# Patient Record
Sex: Female | Born: 1970
Health system: Southern US, Community
[De-identification: ages and names within clinical notes are randomized; demographics above are authoritative.]

## PROBLEM LIST (undated history)

## (undated) DIAGNOSIS — M1990 Unspecified osteoarthritis, unspecified site: Secondary | ICD-10-CM

## (undated) DIAGNOSIS — I10 Essential (primary) hypertension: Secondary | ICD-10-CM

## (undated) DIAGNOSIS — F32A Depression, unspecified: Secondary | ICD-10-CM

## (undated) DIAGNOSIS — F419 Anxiety disorder, unspecified: Secondary | ICD-10-CM

## (undated) DIAGNOSIS — N840 Polyp of corpus uteri: Secondary | ICD-10-CM

## (undated) HISTORY — DX: Unspecified osteoarthritis, unspecified site: M19.90

## (undated) HISTORY — DX: Polyp of corpus uteri: N84.0

## (undated) HISTORY — PX: NO PAST SURGERIES: SHX2092

---

## 1998-07-12 ENCOUNTER — Other Ambulatory Visit: Admission: RE | Admit: 1998-07-12 | Discharge: 1998-07-12 | Payer: Self-pay | Admitting: Obstetrics and Gynecology

## 1999-08-22 ENCOUNTER — Other Ambulatory Visit: Admission: RE | Admit: 1999-08-22 | Discharge: 1999-08-22 | Payer: Self-pay | Admitting: *Deleted

## 2000-09-18 ENCOUNTER — Other Ambulatory Visit: Admission: RE | Admit: 2000-09-18 | Discharge: 2000-09-18 | Payer: Self-pay | Admitting: *Deleted

## 2001-09-30 ENCOUNTER — Other Ambulatory Visit: Admission: RE | Admit: 2001-09-30 | Discharge: 2001-09-30 | Payer: Self-pay | Admitting: Obstetrics and Gynecology

## 2002-10-12 ENCOUNTER — Other Ambulatory Visit: Admission: RE | Admit: 2002-10-12 | Discharge: 2002-10-12 | Payer: Self-pay | Admitting: Obstetrics and Gynecology

## 2003-06-13 ENCOUNTER — Other Ambulatory Visit: Admission: RE | Admit: 2003-06-13 | Discharge: 2003-06-13 | Payer: Self-pay | Admitting: Obstetrics and Gynecology

## 2004-01-06 ENCOUNTER — Inpatient Hospital Stay (HOSPITAL_COMMUNITY): Admission: AD | Admit: 2004-01-06 | Discharge: 2004-01-06 | Payer: Self-pay | Admitting: Obstetrics and Gynecology

## 2004-01-07 ENCOUNTER — Inpatient Hospital Stay (HOSPITAL_COMMUNITY): Admission: AD | Admit: 2004-01-07 | Discharge: 2004-01-09 | Payer: Self-pay | Admitting: *Deleted

## 2004-01-10 ENCOUNTER — Encounter: Admission: RE | Admit: 2004-01-10 | Discharge: 2004-02-09 | Payer: Self-pay | Admitting: Obstetrics and Gynecology

## 2004-02-10 ENCOUNTER — Encounter: Admission: RE | Admit: 2004-02-10 | Discharge: 2004-03-11 | Payer: Self-pay | Admitting: Obstetrics and Gynecology

## 2004-04-11 ENCOUNTER — Encounter: Admission: RE | Admit: 2004-04-11 | Discharge: 2004-05-11 | Payer: Self-pay | Admitting: Obstetrics and Gynecology

## 2004-05-12 ENCOUNTER — Encounter: Admission: RE | Admit: 2004-05-12 | Discharge: 2004-06-11 | Payer: Self-pay | Admitting: Obstetrics and Gynecology

## 2005-09-11 ENCOUNTER — Ambulatory Visit: Payer: Self-pay | Admitting: Family Medicine

## 2006-11-27 ENCOUNTER — Encounter: Admission: RE | Admit: 2006-11-27 | Discharge: 2006-11-27 | Payer: Self-pay | Admitting: Obstetrics and Gynecology

## 2007-01-26 ENCOUNTER — Inpatient Hospital Stay (HOSPITAL_COMMUNITY): Admission: AD | Admit: 2007-01-26 | Discharge: 2007-01-29 | Payer: Self-pay | Admitting: *Deleted

## 2007-01-30 ENCOUNTER — Encounter: Admission: RE | Admit: 2007-01-30 | Discharge: 2007-03-01 | Payer: Self-pay | Admitting: *Deleted

## 2007-03-02 ENCOUNTER — Encounter: Admission: RE | Admit: 2007-03-02 | Discharge: 2007-03-31 | Payer: Self-pay | Admitting: *Deleted

## 2007-04-01 ENCOUNTER — Encounter: Admission: RE | Admit: 2007-04-01 | Discharge: 2007-05-01 | Payer: Self-pay | Admitting: *Deleted

## 2007-04-23 ENCOUNTER — Ambulatory Visit: Payer: Self-pay | Admitting: Family Medicine

## 2007-04-23 DIAGNOSIS — J029 Acute pharyngitis, unspecified: Secondary | ICD-10-CM

## 2007-05-02 ENCOUNTER — Encounter: Admission: RE | Admit: 2007-05-02 | Discharge: 2007-06-01 | Payer: Self-pay | Admitting: *Deleted

## 2007-05-22 ENCOUNTER — Telehealth (INDEPENDENT_AMBULATORY_CARE_PROVIDER_SITE_OTHER): Payer: Self-pay | Admitting: *Deleted

## 2007-06-02 ENCOUNTER — Encounter: Admission: RE | Admit: 2007-06-02 | Discharge: 2007-07-01 | Payer: Self-pay | Admitting: *Deleted

## 2007-07-02 ENCOUNTER — Encounter: Admission: RE | Admit: 2007-07-02 | Discharge: 2007-08-01 | Payer: Self-pay | Admitting: *Deleted

## 2008-11-02 ENCOUNTER — Inpatient Hospital Stay (HOSPITAL_COMMUNITY): Admission: RE | Admit: 2008-11-02 | Discharge: 2008-11-04 | Payer: Self-pay | Admitting: Obstetrics

## 2008-11-05 ENCOUNTER — Encounter: Admission: RE | Admit: 2008-11-05 | Discharge: 2008-12-02 | Payer: Self-pay | Admitting: Obstetrics

## 2008-12-03 ENCOUNTER — Encounter: Admission: RE | Admit: 2008-12-03 | Discharge: 2008-12-30 | Payer: Self-pay | Admitting: Obstetrics

## 2010-08-17 ENCOUNTER — Ambulatory Visit (HOSPITAL_COMMUNITY)
Admission: RE | Admit: 2010-08-17 | Discharge: 2010-08-17 | Payer: Self-pay | Source: Home / Self Care | Attending: Obstetrics | Admitting: Obstetrics

## 2010-11-21 ENCOUNTER — Other Ambulatory Visit (HOSPITAL_COMMUNITY): Payer: Self-pay | Admitting: Obstetrics

## 2010-11-21 DIAGNOSIS — Z302 Encounter for sterilization: Secondary | ICD-10-CM

## 2010-11-27 ENCOUNTER — Ambulatory Visit (HOSPITAL_COMMUNITY)
Admission: RE | Admit: 2010-11-27 | Discharge: 2010-11-27 | Disposition: A | Discharge status: Home / Self Care | Payer: BC Managed Care – PPO | Source: Ambulatory Visit | Attending: Obstetrics | Admitting: Obstetrics

## 2010-11-27 DIAGNOSIS — Z302 Encounter for sterilization: Secondary | ICD-10-CM

## 2010-11-27 DIAGNOSIS — Z3049 Encounter for surveillance of other contraceptives: Secondary | ICD-10-CM | POA: Insufficient documentation

## 2010-12-25 LAB — CBC
HCT: 29.7 % — ABNORMAL LOW (ref 36.0–46.0)
HCT: 35.3 % — ABNORMAL LOW (ref 36.0–46.0)
Hemoglobin: 10.3 g/dL — ABNORMAL LOW (ref 12.0–15.0)
MCHC: 34 g/dL (ref 30.0–36.0)
MCV: 90.9 fL (ref 78.0–100.0)
Platelets: 174 10*3/uL (ref 150–400)
RBC: 3.27 MIL/uL — ABNORMAL LOW (ref 3.87–5.11)
RDW: 13.8 % (ref 11.5–15.5)
WBC: 8.6 10*3/uL (ref 4.0–10.5)

## 2010-12-25 LAB — RPR: RPR Ser Ql: NONREACTIVE

## 2012-06-23 ENCOUNTER — Other Ambulatory Visit: Payer: Self-pay | Admitting: Obstetrics

## 2012-06-23 DIAGNOSIS — Z1231 Encounter for screening mammogram for malignant neoplasm of breast: Secondary | ICD-10-CM

## 2012-07-23 ENCOUNTER — Ambulatory Visit
Admission: RE | Admit: 2012-07-23 | Discharge: 2012-07-23 | Disposition: A | Discharge status: Home / Self Care | Payer: BC Managed Care – PPO | Source: Ambulatory Visit | Attending: Obstetrics | Admitting: Obstetrics

## 2012-07-23 DIAGNOSIS — Z1231 Encounter for screening mammogram for malignant neoplasm of breast: Secondary | ICD-10-CM

## 2012-07-28 ENCOUNTER — Other Ambulatory Visit: Payer: Self-pay | Admitting: Obstetrics

## 2012-07-28 DIAGNOSIS — R928 Other abnormal and inconclusive findings on diagnostic imaging of breast: Secondary | ICD-10-CM

## 2012-08-03 ENCOUNTER — Ambulatory Visit
Admission: RE | Admit: 2012-08-03 | Discharge: 2012-08-03 | Disposition: A | Discharge status: Home / Self Care | Payer: BC Managed Care – PPO | Source: Ambulatory Visit | Attending: Obstetrics | Admitting: Obstetrics

## 2012-08-03 DIAGNOSIS — R928 Other abnormal and inconclusive findings on diagnostic imaging of breast: Secondary | ICD-10-CM

## 2012-12-11 ENCOUNTER — Encounter (HOSPITAL_COMMUNITY): Payer: Self-pay | Admitting: *Deleted

## 2012-12-11 ENCOUNTER — Emergency Department (HOSPITAL_COMMUNITY)
Admission: EM | Admit: 2012-12-11 | Discharge: 2012-12-11 | Disposition: A | Discharge status: Home / Self Care | Payer: BC Managed Care – PPO | Attending: Emergency Medicine | Admitting: Emergency Medicine

## 2012-12-11 DIAGNOSIS — S0990XA Unspecified injury of head, initial encounter: Secondary | ICD-10-CM | POA: Insufficient documentation
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
## 2012-12-11 NOTE — ED Notes (Signed)
[employer] at bedside 

## 2012-12-12 NOTE — ED Provider Notes (Signed)
  Medical screening examination/treatment/procedure(s) were performed by non-physician practitioner and as supervising physician I was immediately available for consultation/collaboration.    Dimitrious Micciche, MD 12/12/12 0029 

## 2013-05-03 ENCOUNTER — Ambulatory Visit (INDEPENDENT_AMBULATORY_CARE_PROVIDER_SITE_OTHER): Payer: BC Managed Care – PPO | Admitting: Family Medicine

## 2013-05-03 ENCOUNTER — Encounter: Payer: Self-pay | Admitting: Family Medicine

## 2013-05-03 VITALS — BP 110/74 | HR 76 | Temp 98.3°F | Wt 203.0 lb

## 2013-05-03 DIAGNOSIS — J029 Acute pharyngitis, unspecified: Secondary | ICD-10-CM

## 2013-05-03 DIAGNOSIS — Z639 Problem related to primary support group, unspecified: Secondary | ICD-10-CM | POA: Insufficient documentation

## 2013-05-03 DIAGNOSIS — J020 Streptococcal pharyngitis: Secondary | ICD-10-CM

## 2013-05-03 NOTE — Progress Notes (Signed)
Subjective:    Patient ID: Sheri Cole, female    DOB: 09/14/1970, 42 y.o.   MRN: [id number]  HPI CC: ST  [name] presents today after not being seen here since 2008 for acute visit of sore throat. Sore throat present for last 6 days.  Affecting sleep at night.  Hurts to swallow.  Also noticed tender glands in neck.  Mild PNDrainage.  Mild earache. Some menstrual headaches (common for her).  No fevers/chills, rhinorrhea, congestion, sneezing, cough, tooth pain.  No abd pain, nausea/vomiting.  So far has tried tylenol, throat sprays OTC and gargling. 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 No h/o allergic rhinitis.  Stressful year- husband committed suicide 12/2012.  They were in process of divorcing Children (4yo, 6yo, 9yo) involved in [employer]. Pt feels doing well with grieving process.  Lives with children (4yo, 6yo, 31yo) Occupation: sells food  Preventative:  Well woman with OBGYN Dr. Algie Coffer (06/2012) - normal. Tetanus - unsure.  Thinks less than 10 yrs.  Medications and allergies reviewed and updated in chart.  Past histories reviewed and updated if relevant as below. Patient Active Problem List   Diagnosis Date Noted  . PHARYNGITIS, STREPTOCOCCAL 04/23/2007   History reviewed. No pertinent past medical history. Past Surgical History  Procedure Laterality Date  . Tubal ligation  2011   History  Substance Use Topics  . Smoking status: Never Smoker   . Smokeless tobacco: Never Used  . Alcohol Use: Yes     Comment: occasionally   Family History  Problem Relation Age of Onset  . Cancer Father     metastatic lung (smoker)  . Cancer Maternal Grandmother     breast, leukemia  . Cancer Paternal Grandmother     ovarian or cervical  . Diabetes Father   . Hypertension Neg Hx   . CAD Neg Hx   . Stroke Neg Hx    Allergies  Allergen Reactions  . Sulfonamide Derivatives     REACTION: hives   No current outpatient prescriptions on file prior to  visit.   No current facility-administered medications on file prior to visit.    Review of Systems Per HPI    Objective:   Physical Exam  Nursing note and vitals reviewed. Constitutional: She appears well-developed and well-nourished. No distress.  HENT:  Head: Normocephalic and atraumatic.  Right Ear: Hearing, tympanic membrane, external ear and ear canal normal.  Left Ear: Hearing, tympanic membrane, external ear and ear canal normal.  Nose: Nose normal. No mucosal edema or rhinorrhea. Right sinus exhibits no maxillary sinus tenderness and no frontal sinus tenderness. Left sinus exhibits no maxillary sinus tenderness and no frontal sinus tenderness.  Mouth/Throat: Uvula is midline and mucous membranes are normal. Posterior oropharyngeal edema and posterior oropharyngeal erythema present. No oropharyngeal exudate or tonsillar abscesses.  No exudates  Eyes: Conjunctivae and EOM are normal. Pupils are equal, round, and reactive to light. No scleral icterus.  Neck: Normal range of motion. Neck supple.  Cardiovascular: Normal rate, regular rhythm, normal heart sounds and intact distal pulses.   No murmur heard. Pulmonary/Chest: Effort normal and breath sounds normal. No respiratory distress. She has no wheezes. She has no rales.  Musculoskeletal: She exhibits no edema.  Lymphadenopathy:    She has no cervical adenopathy.  Skin: Skin is warm and dry. No rash noted.  Psychiatric: She has a normal mood and affect. Her behavior is normal. Judgment and thought content normal.       Assessment &  Plan:

## 2013-05-03 NOTE — Patient Instructions (Addendum)
You have viral pharyngitis. Push fluids and plenty of rest. May use ibuprofen 400-600mg  with food for throat inflammation. Salt water gargles. Suck on cold things like popsicles or warm things like herbal teas (whichever soothes the throat better). Return if fever >101.5, worsening pain, or trouble opening/closing mouth, or hoarse voice. Cancel upcoming appointment, but bring me a copy of recent blood work to update your chart. Good to see you today, call clinic with questions.

## 2013-05-03 NOTE — Assessment & Plan Note (Signed)
Anticipate viral given lack of cervical adenopathy and fever. Supportive care as per instructions.

## 2013-05-03 NOTE — Assessment & Plan Note (Signed)
Discussed husband's recent suicide. Pt has good support network at home. Considering counseling mainly for benefit of children and help in answering their questions. Knows where to seek help if becoming overwhelming.

## 2013-05-13 ENCOUNTER — Ambulatory Visit: Payer: BC Managed Care – PPO | Admitting: Family Medicine

## 2013-07-14 ENCOUNTER — Other Ambulatory Visit: Payer: Self-pay

## 2013-07-14 DIAGNOSIS — Z1231 Encounter for screening mammogram for malignant neoplasm of breast: Secondary | ICD-10-CM

## 2013-07-14 DIAGNOSIS — Z803 Family history of malignant neoplasm of breast: Secondary | ICD-10-CM

## 2013-08-17 ENCOUNTER — Ambulatory Visit
Admission: RE | Admit: 2013-08-17 | Discharge: 2013-08-17 | Disposition: A | Discharge status: Home / Self Care | Payer: BC Managed Care – PPO | Source: Ambulatory Visit

## 2013-08-17 DIAGNOSIS — Z1231 Encounter for screening mammogram for malignant neoplasm of breast: Secondary | ICD-10-CM

## 2013-08-17 DIAGNOSIS — Z803 Family history of malignant neoplasm of breast: Secondary | ICD-10-CM

## 2013-08-19 ENCOUNTER — Other Ambulatory Visit: Payer: Self-pay | Admitting: Obstetrics

## 2013-08-19 DIAGNOSIS — R928 Other abnormal and inconclusive findings on diagnostic imaging of breast: Secondary | ICD-10-CM

## 2013-09-07 ENCOUNTER — Ambulatory Visit
Admission: RE | Admit: 2013-09-07 | Discharge: 2013-09-07 | Disposition: A | Discharge status: Home / Self Care | Payer: BC Managed Care – PPO | Source: Ambulatory Visit | Attending: Obstetrics | Admitting: Obstetrics

## 2013-09-07 DIAGNOSIS — R928 Other abnormal and inconclusive findings on diagnostic imaging of breast: Secondary | ICD-10-CM

## 2013-09-21 ENCOUNTER — Ambulatory Visit (INDEPENDENT_AMBULATORY_CARE_PROVIDER_SITE_OTHER): Payer: BC Managed Care – PPO | Admitting: Internal Medicine

## 2013-09-21 ENCOUNTER — Encounter: Payer: Self-pay | Admitting: Internal Medicine

## 2013-09-21 VITALS — BP 124/86 | HR 73 | Temp 98.0°F | Wt 207.8 lb

## 2013-09-21 DIAGNOSIS — J020 Streptococcal pharyngitis: Secondary | ICD-10-CM

## 2013-09-21 DIAGNOSIS — J029 Acute pharyngitis, unspecified: Secondary | ICD-10-CM

## 2013-09-21 LAB — POCT RAPID STREP A (OFFICE): RAPID STREP A SCREEN: NEGATIVE

## 2013-09-21 NOTE — Patient Instructions (Signed)
Viral Pharyngitis Viral pharyngitis is a viral infection that produces redness, pain, and swelling (inflammation) of the throat. It can spread from person to person (contagious). CAUSES Viral pharyngitis is caused by inhaling a large amount of certain germs called viruses. Many different viruses cause viral pharyngitis. SYMPTOMS Symptoms of viral pharyngitis include:  Sore throat.  Tiredness.  Stuffy nose.  Low-grade fever.  Congestion.  Cough. TREATMENT Treatment includes rest, drinking plenty of fluids, and the use of over-the-counter medication (approved by your caregiver). HOME CARE INSTRUCTIONS   Drink enough fluids to keep your urine clear or pale yellow.  Eat soft, cold foods such as ice cream, frozen ice pops, or gelatin dessert.  Gargle with warm salt water (1 tsp salt per 1 qt of water).  If over age 7, throat lozenges may be used safely.  Only take over-the-counter or prescription medicines for pain, discomfort, or fever as directed by your caregiver. Do not take aspirin. To help prevent spreading viral pharyngitis to others, avoid:  Mouth-to-mouth contact with others.  Sharing utensils for eating and drinking.  Coughing around others. SEEK MEDICAL CARE IF:   You are better in a few days, then become worse.  You have a fever or pain not helped by pain medicines.  There are any other changes that concern you. Document Released: 06/05/2005 Document Revised: 11/18/2011 Document Reviewed: 11/01/2010 [employer] Patient Information 2014 [employer].  

## 2013-09-21 NOTE — Progress Notes (Signed)
HPI  Pt presents to the clinic today with c/o sore throat. This started about 3 days ago. She does report mild pain with swallowing and white spots on her tonsils. She denies fevers, chills or body aches. She has tried gargling with salt water. She has had sick contacts.  Review of Systems     History reviewed. No pertinent past medical history.  Family History  Problem Relation Age of Onset  . Cancer Father     metastatic lung (smoker)  . Cancer Maternal Grandmother     breast, leukemia  . Cancer Paternal Grandmother     ovarian or cervical  . Diabetes Father   . Hypertension Neg Hx   . CAD Neg Hx   . Stroke Neg Hx     History   Social History  . Marital Status: Married    Spouse Name: N/A    Number of Children: N/A  . Years of Education: N/A   Occupational History  . Not on file.   Social History Main Topics  . Smoking status: Never Smoker   . Smokeless tobacco: Never Used  . Alcohol Use: Yes     Comment: occasionally  . Drug Use: No  . Sexual Activity: Not on file   Other Topics Concern  . Not on file   Social History Narrative   Husband committed suicide 12/2012   Lives with children (2yo, 6yo, 35yo)   Occupation: sells food    Allergies  Allergen Reactions  . Sulfonamide Derivatives     REACTION: hives     Constitutional:  Denies headache, fatigue, fever or abrupt weight changes.  HEENT:  Positive sore throat. Denies eye redness, eye pain, pressure behind the eyes, facial pain, nasal congestion, ear pain, ringing in the ears, wax buildup, runny nose or bloody nose. Respiratory: Denies cough, difficulty breathing or shortness of breath.  Cardiovascular: Denies chest pain, chest tightness, palpitations or swelling in the hands or feet.   No other specific complaints in a complete review of systems (except as listed in HPI above).  Objective:   BP 124/86  Pulse 73  Temp(Src) 98 F (36.7 C) (Oral)  Wt 207 lb 12 oz (94.235 kg)  SpO2 98% Wt  Readings from Last 3 Encounters:  09/21/13 207 lb 12 oz (94.235 kg)  05/03/13 203 lb (92.08 kg)  04/23/07 202 lb (91.627 kg)     General: Appears her stated age, well developed, well nourished in NAD. HEENT: Head: normal shape and size; Eyes: sclera white, no icterus, conjunctiva pink, PERRLA and EOMs intact; Ears: Tm's gray and intact, normal light reflex; Nose: mucosa pink and moist, septum midline; Throat/Mouth: + PND. Teeth present, mucosa erythematous and moist, patchy white exudate noted, no lesions or ulcerations noted.  Neck: Mild tonsillar lymphadenopathy. Neck supple, trachea midline. No massses, lumps or thyromegaly present.  Cardiovascular: Normal rate and rhythm. S1,S2 noted.  No murmur, rubs or gallops noted. No JVD or BLE edema. No carotid bruits noted. Pulmonary/Chest: Normal effort and positive vesicular breath sounds. No respiratory distress. No wheezes, rales or ronchi noted.      Assessment & Plan:  Acute Viral Pharyngitis:  RST- negative Get some rest and drink plenty of water Do salt water gargles for the sore throat If you develop fever or increased pain with swallowing, please RTC   RTC as needed or if symptoms persist.

## 2013-09-21 NOTE — Progress Notes (Signed)
Pre-visit discussion using our clinic review tool. No additional management support is needed unless otherwise documented below in the visit note.  

## 2014-06-20 IMAGING — MG MM DIGITAL DIAGNOSTIC UNILAT R
1 series · 1 of 1 positions shown · non-contrast
Comparison: With priors.

CLINICAL DATA: Abnormal right screening mammogram.

EXAM:
DIGITAL DIAGNOSTIC  RIGHT MAMMOGRAM
ULTRASOUND RIGHT BREAST

[R MLO]
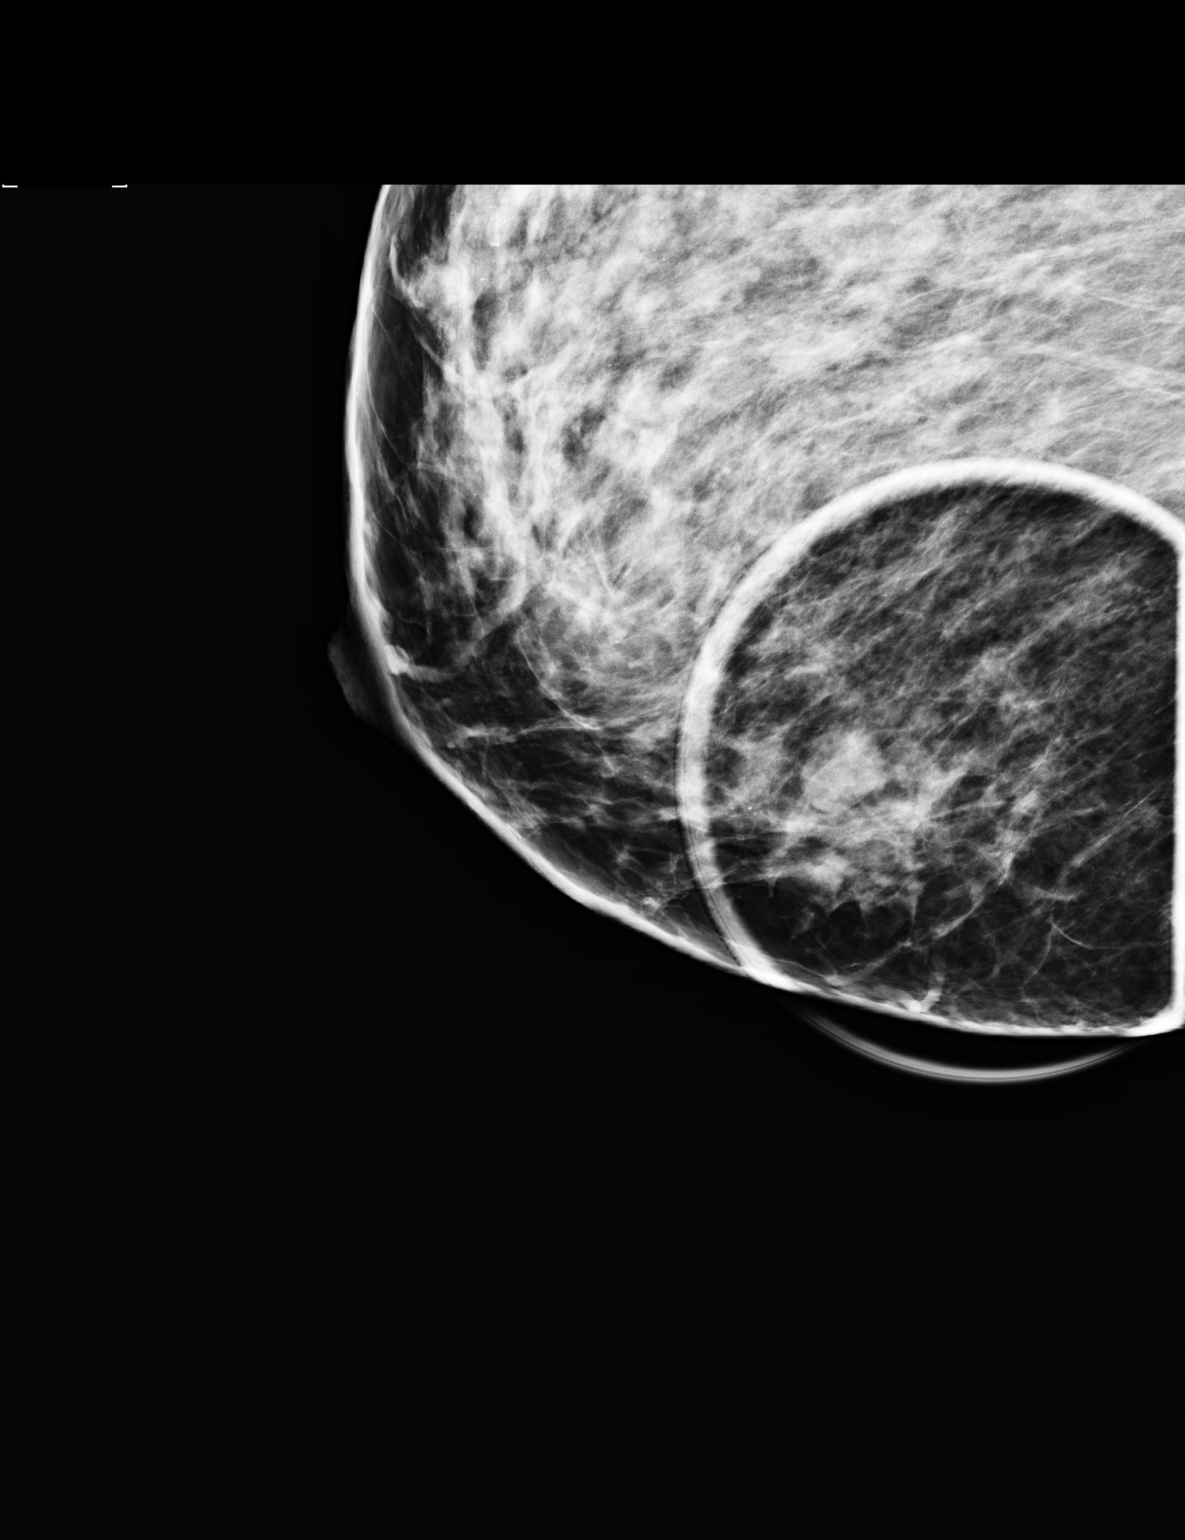

[1 of 1 positions shown; findings below may reference images not displayed]

ACR Breast Density Category c: The breasts are heterogeneously
dense, which may obscure small masses.
FINDINGS: Spot compression views of the 6 o'clock region of the right breast
were obtained. There is a low-density, ovoid 1.3 cm obscured mass.
There are no malignant type microcalcifications.

On physical exam I do not palpate a mass in the right breast.

Ultrasound is performed, showing a simple cyst in the right breast
at 6 o'clock 3 cm from the nipple measuring 1.1 x 0.9 x 1.2 cm.
IMPRESSION: Right breast cyst.  No evidence of malignancy in the right breast.

RECOMMENDATION:
Bilateral screening mammogram in 1 year is recommended.

I have discussed the findings and recommendations with the patient.
Results were also provided in writing at the conclusion of the
visit. If applicable, a reminder letter will be sent to the patient
regarding the next appointment.

BI-RADS CATEGORY  2: Benign Finding(s)

## 2014-08-19 ENCOUNTER — Other Ambulatory Visit: Payer: Self-pay

## 2014-08-19 DIAGNOSIS — Z1231 Encounter for screening mammogram for malignant neoplasm of breast: Secondary | ICD-10-CM

## 2014-08-22 ENCOUNTER — Ambulatory Visit
Admission: RE | Admit: 2014-08-22 | Discharge: 2014-08-22 | Disposition: A | Discharge status: Home / Self Care | Payer: BC Managed Care – PPO | Source: Ambulatory Visit

## 2014-08-22 DIAGNOSIS — Z1231 Encounter for screening mammogram for malignant neoplasm of breast: Secondary | ICD-10-CM

## 2014-09-09 DIAGNOSIS — N840 Polyp of corpus uteri: Secondary | ICD-10-CM

## 2014-09-09 HISTORY — DX: Polyp of corpus uteri: N84.0

## 2014-10-07 ENCOUNTER — Other Ambulatory Visit: Payer: Self-pay | Admitting: Obstetrics

## 2014-10-10 ENCOUNTER — Encounter (HOSPITAL_COMMUNITY): Payer: Self-pay | Admitting: *Deleted

## 2014-10-14 ENCOUNTER — Ambulatory Visit (HOSPITAL_COMMUNITY): Payer: BLUE CROSS/BLUE SHIELD | Admitting: Anesthesiology

## 2014-10-14 ENCOUNTER — Encounter (HOSPITAL_COMMUNITY): Payer: Self-pay | Admitting: *Deleted

## 2014-10-14 ENCOUNTER — Ambulatory Visit (HOSPITAL_COMMUNITY)
Admission: RE | Admit: 2014-10-14 | Discharge: 2014-10-14 | Disposition: A | Discharge status: Home / Self Care | Payer: BLUE CROSS/BLUE SHIELD | Source: Ambulatory Visit | Attending: Obstetrics | Admitting: Obstetrics

## 2014-10-14 ENCOUNTER — Encounter (HOSPITAL_COMMUNITY)
Admission: RE | Disposition: A | Discharge status: Home / Self Care | Payer: Self-pay | Source: Ambulatory Visit | Attending: Obstetrics

## 2014-10-14 DIAGNOSIS — Z6831 Body mass index (BMI) 31.0-31.9, adult: Secondary | ICD-10-CM | POA: Diagnosis not present

## 2014-10-14 DIAGNOSIS — N840 Polyp of corpus uteri: Secondary | ICD-10-CM | POA: Insufficient documentation

## 2014-10-14 DIAGNOSIS — E669 Obesity, unspecified: Secondary | ICD-10-CM | POA: Diagnosis not present

## 2014-10-14 HISTORY — PX: DILATATION & CURETTAGE/HYSTEROSCOPY WITH MYOSURE: SHX6511

## 2014-10-14 LAB — CBC
HCT: 38.1 % (ref 36.0–46.0)
HEMOGLOBIN: 12.9 g/dL (ref 12.0–15.0)
MCH: 29.7 pg (ref 26.0–34.0)
MCHC: 33.9 g/dL (ref 30.0–36.0)
MCV: 87.8 fL (ref 78.0–100.0)
Platelets: 299 10*3/uL (ref 150–400)
RBC: 4.34 MIL/uL (ref 3.87–5.11)
RDW: 12.8 % (ref 11.5–15.5)
WBC: 6.1 10*3/uL (ref 4.0–10.5)

## 2014-10-14 LAB — BASIC METABOLIC PANEL
ANION GAP: 5 (ref 5–15)
BUN: 9 mg/dL (ref 6–23)
CO2: 28 mmol/L (ref 19–32)
Calcium: 9.1 mg/dL (ref 8.4–10.5)
Chloride: 105 mmol/L (ref 96–112)
Creatinine, Ser: 0.65 mg/dL (ref 0.50–1.10)
GFR calc Af Amer: >90 mL/min (ref >90)
GLUCOSE: 93 mg/dL (ref 70–99)
Potassium: 3.7 mmol/L (ref 3.5–5.1)
Sodium: 138 mmol/L (ref 135–145)

## 2014-10-14 LAB — PREGNANCY, URINE: Preg Test, Ur: NEGATIVE

## 2014-10-14 SURGERY — DILATATION & CURETTAGE/HYSTEROSCOPY WITH MYOSURE
Anesthesia: General | Site: Uterus

## 2014-10-14 MED ORDER — ONDANSETRON HCL 4 MG/2ML IJ SOLN
INTRAMUSCULAR | Status: AC
  Filled 2014-10-14: qty 2

## 2014-10-14 MED ORDER — MIDAZOLAM HCL 2 MG/2ML IJ SOLN: 0.5000 mg | Freq: Once | INTRAMUSCULAR | Status: DC | PRN

## 2014-10-14 MED ORDER — MEPERIDINE HCL 25 MG/ML IJ SOLN: 6.2500 mg | INTRAMUSCULAR | Status: DC | PRN

## 2014-10-14 MED ORDER — LIDOCAINE HCL (CARDIAC) 20 MG/ML IV SOLN
INTRAVENOUS | Status: AC
  Filled 2014-10-14: qty 5

## 2014-10-14 MED ORDER — LACTATED RINGERS IV SOLN
INTRAVENOUS | Status: DC
  Administered 2014-10-14: 14:00:00 via INTRAVENOUS

## 2014-10-14 MED ORDER — KETOROLAC TROMETHAMINE 30 MG/ML IJ SOLN
INTRAMUSCULAR | Status: DC | PRN
  Administered 2014-10-14: 30 mg via INTRAVENOUS

## 2014-10-14 MED ORDER — MIDAZOLAM HCL 5 MG/5ML IJ SOLN
INTRAMUSCULAR | Status: DC | PRN
  Administered 2014-10-14: 2 mg via INTRAVENOUS

## 2014-10-14 MED ORDER — PROPOFOL 10 MG/ML IV BOLUS
INTRAVENOUS | Status: DC | PRN
  Administered 2014-10-14: 180 mg via INTRAVENOUS

## 2014-10-14 MED ORDER — KETOROLAC TROMETHAMINE 30 MG/ML IJ SOLN
INTRAMUSCULAR | Status: AC
  Filled 2014-10-14: qty 1

## 2014-10-14 MED ORDER — SODIUM CHLORIDE 0.9 % IR SOLN
Status: DC | PRN
  Administered 2014-10-14 (×2): 3000 mL

## 2014-10-14 MED ORDER — FERRIC SUBSULFATE 259 MG/GM EX SOLN
CUTANEOUS | Status: AC
  Filled 2014-10-14: qty 8

## 2014-10-14 MED ORDER — CHLOROPROCAINE HCL 1 % IJ SOLN
INTRAMUSCULAR | Status: AC
  Filled 2014-10-14: qty 30

## 2014-10-14 MED ORDER — BUPIVACAINE HCL (PF) 0.5 % IJ SOLN
INTRAMUSCULAR | Status: AC
  Filled 2014-10-14: qty 30

## 2014-10-14 MED ORDER — SCOPOLAMINE 1 MG/3DAYS TD PT72
1.0000 | MEDICATED_PATCH | Freq: Once | TRANSDERMAL | Status: DC
  Administered 2014-10-14: 1.5 mg via TRANSDERMAL

## 2014-10-14 MED ORDER — SILVER NITRATE-POT NITRATE 75-25 % EX MISC
CUTANEOUS | Status: AC
  Filled 2014-10-14: qty 1

## 2014-10-14 MED ORDER — ACETAMINOPHEN 160 MG/5ML PO SOLN: 325.0000 mg | ORAL | Status: DC | PRN

## 2014-10-14 MED ORDER — ONDANSETRON HCL 4 MG/2ML IJ SOLN
INTRAMUSCULAR | Status: DC | PRN
  Administered 2014-10-14: 4 mg via INTRAVENOUS

## 2014-10-14 MED ORDER — IBUPROFEN 800 MG PO TABS: 800.0000 mg | ORAL_TABLET | Freq: Three times a day (TID) | ORAL | Status: DC | PRN

## 2014-10-14 MED ORDER — DEXAMETHASONE SODIUM PHOSPHATE 10 MG/ML IJ SOLN
INTRAMUSCULAR | Status: DC | PRN
  Administered 2014-10-14: 10 mg via INTRAVENOUS

## 2014-10-14 MED ORDER — BUPIVACAINE HCL 0.5 % IJ SOLN
INTRAMUSCULAR | Status: DC | PRN
  Administered 2014-10-14: 10 mL

## 2014-10-14 MED ORDER — ACETAMINOPHEN 325 MG PO TABS: 325.0000 mg | ORAL_TABLET | ORAL | Status: DC | PRN

## 2014-10-14 MED ORDER — PROPOFOL 10 MG/ML IV BOLUS
INTRAVENOUS | Status: AC
  Filled 2014-10-14: qty 20

## 2014-10-14 MED ORDER — FENTANYL CITRATE 0.05 MG/ML IJ SOLN
INTRAMUSCULAR | Status: AC
  Filled 2014-10-14: qty 2

## 2014-10-14 MED ORDER — KETOROLAC TROMETHAMINE 30 MG/ML IJ SOLN: 30.0000 mg | Freq: Once | INTRAMUSCULAR | Status: DC | PRN

## 2014-10-14 MED ORDER — FENTANYL CITRATE 0.05 MG/ML IJ SOLN
INTRAMUSCULAR | Status: DC | PRN
  Administered 2014-10-14 (×2): 50 ug via INTRAVENOUS

## 2014-10-14 MED ORDER — SCOPOLAMINE 1 MG/3DAYS TD PT72
MEDICATED_PATCH | TRANSDERMAL | Status: AC
  Filled 2014-10-14: qty 1

## 2014-10-14 MED ORDER — OXYCODONE-ACETAMINOPHEN 5-325 MG PO TABS: 2.0000 | ORAL_TABLET | ORAL | Status: DC | PRN

## 2014-10-14 MED ORDER — DEXAMETHASONE SODIUM PHOSPHATE 10 MG/ML IJ SOLN
INTRAMUSCULAR | Status: AC
  Filled 2014-10-14: qty 1

## 2014-10-14 MED ORDER — MIDAZOLAM HCL 2 MG/2ML IJ SOLN
INTRAMUSCULAR | Status: AC
  Filled 2014-10-14: qty 2

## 2014-10-14 MED ORDER — LIDOCAINE HCL (CARDIAC) 20 MG/ML IV SOLN
INTRAVENOUS | Status: DC | PRN
  Administered 2014-10-14: 80 mg via INTRAVENOUS

## 2014-10-14 MED ORDER — PROMETHAZINE HCL 25 MG/ML IJ SOLN: 6.2500 mg | INTRAMUSCULAR | Status: DC | PRN

## 2014-10-14 MED ORDER — SILVER NITRATE-POT NITRATE 75-25 % EX MISC
CUTANEOUS | Status: DC | PRN
  Administered 2014-10-14: 4

## 2014-10-14 MED ORDER — FENTANYL CITRATE 0.05 MG/ML IJ SOLN: 25.0000 ug | INTRAMUSCULAR | Status: DC | PRN

## 2014-10-14 SURGICAL SUPPLY — 25 items
BLADE INCISOR TRUC PLUS 2.9 (ABLATOR) IMPLANT
CANISTERS HI-FLOW 3000CC (CANNISTER) ×8 IMPLANT
CATH ROBINSON RED A/P 16FR (CATHETERS) ×4 IMPLANT
CLOTH BEACON ORANGE TIMEOUT ST (SAFETY) ×4 IMPLANT
CONTAINER PREFILL 10% NBF 60ML (FORM) ×8 IMPLANT
DEVICE MYOSURE CLASSIC (MISCELLANEOUS) ×4 IMPLANT
DEVICE MYOSURE LITE (MISCELLANEOUS) IMPLANT
FILTER ARTHROSCOPY CONVERTOR (FILTER) ×4 IMPLANT
GLOVE BIO SURGEON STRL SZ 6.5 (GLOVE) ×6 IMPLANT
GLOVE BIO SURGEONS STRL SZ 6.5 (GLOVE) ×2
GLOVE BIOGEL PI IND STRL 7.0 (GLOVE) ×6 IMPLANT
GLOVE BIOGEL PI INDICATOR 7.0 (GLOVE) ×6
GLOVE ECLIPSE 7.0 STRL STRAW (GLOVE) ×4 IMPLANT
GOWN STRL REUS W/TWL LRG LVL3 (GOWN DISPOSABLE) ×8 IMPLANT
INCISOR TRUC PLUS BLADE 2.9 (ABLATOR)
KIT HYSTEROSCOPY TRUCLEAR (ABLATOR) IMPLANT
MORCELLATOR RECIP TRUCLEAR 4.0 (ABLATOR) IMPLANT
PACK VAGINAL MINOR WOMEN LF (CUSTOM PROCEDURE TRAY) ×4 IMPLANT
PAD OB MATERNITY 4.3X12.25 (PERSONAL CARE ITEMS) ×4 IMPLANT
SEAL ROD LENS SCOPE MYOSURE (ABLATOR) ×4 IMPLANT
STENT BALLN UTERINE 4CM 6FR (STENTS) IMPLANT
TOWEL OR 17X24 6PK STRL BLUE (TOWEL DISPOSABLE) ×8 IMPLANT
TUBING AQUILEX INFLOW (TUBING) ×4 IMPLANT
TUBING AQUILEX OUTFLOW (TUBING) ×4 IMPLANT
WATER STERILE IRR 1000ML POUR (IV SOLUTION) ×4 IMPLANT

## 2014-10-14 NOTE — Brief Op Note (Signed)
10/14/2014  2:51 PM  PATIENT:  Beadle  44 y.o. female  PRE-OPERATIVE DIAGNOSIS:  Endometrial Polyp  POST-OPERATIVE DIAGNOSIS:  Endometrial Polyp  PROCEDURE:  Procedure(s): DILATATION & CURETTAGE/HYSTEROSCOPY WITH MYOSURE (N/A)  polypectomy  SURGEON:  Surgeon(s) and Role:    * Ihan Pat A. Pamala Hurry, MD - Primary  PHYSICIAN ASSISTANT:   ASSISTANTS: none   ANESTHESIA:   local and general  EBL:  Total I/O In: 1000 [I.V.:1000] Out: 30 [Urine:25; Blood:5]  BLOOD ADMINISTERED:none  DRAINS: none   LOCAL MEDICATIONS USED:  MARCAINE     SPECIMEN:  Source of Specimen:  endometrial polyp, endometrial curretings  DISPOSITION OF SPECIMEN:  PATHOLOGY  COUNTS:  YES  TOURNIQUET:  * No tourniquets in log *  DICTATION: .Note written in EPIC  PLAN OF CARE: Discharge to home after PACU  PATIENT DISPOSITION:  PACU - hemodynamically stable.   Delay start of Pharmacological VTE agent (>24hrs) due to surgical blood loss or risk of bleeding: yes

## 2014-10-14 NOTE — H&P (Signed)
44 yo [id number] with menorrhagia and fndings of endometrial polyp here for polypectomy with D&C, hysteroscopy. Prior Essure TL. utuers 8x6x5 cm, EMS 21mm, SIS 1.6 x 1.0 x 1.3 cm polyp from ant wall.   Pt notes bleeding starting today.  Past Medical History  Diagnosis Date  . Medical history non-contributory   . Vaginal delivery 2005, 2008, 2010   obesity  Past Surgical History  Procedure Laterality Date  . No past surgeries    Essure     All: Sulfa  PE: Filed Vitals:   10/14/14 1301  BP: 152/86  Pulse: 75  Temp: 98.4 F (36.9 C)  TempSrc: Oral  Resp: 18  Height: 5\' 8"  (1.727 m)  Weight: 92.987 kg (205 lb)  SpO2: 100%   Gen: well appearing, no distress Abd: obese, soft, NT, ND GU: def to OR LE: NT, no edema  CBC    Component Value Date/Time   WBC 6.1 10/14/2014 1300   RBC 4.34 10/14/2014 1300   HGB 12.9 10/14/2014 1300   HCT 38.1 10/14/2014 1300   PLT 299 10/14/2014 1300   MCV 87.8 10/14/2014 1300   MCH 29.7 10/14/2014 1300   MCHC 33.9 10/14/2014 1300   RDW 12.8 10/14/2014 1300    A/p: polypectomy as planned with hysteroscopy, D&C R/B reviewed w/ pt.   Leimomi Zervas A. 10/14/2014 1:59 PM

## 2014-10-14 NOTE — Transfer of Care (Signed)
Immediate Anesthesia Transfer of Care Note  Patient: Sheri Cole  Procedure(s) Performed: Procedure(s): DILATATION & CURETTAGE/HYSTEROSCOPY WITH MYOSURE (N/A)  Patient Location: PACU  Anesthesia Type:General  Level of Consciousness: sedated  Airway & Oxygen Therapy: Patient Spontanous Breathing and Patient connected to nasal cannula oxygen  Post-op Assessment: Report given to RN and Post -op Vital signs reviewed and stable  Post vital signs: stable  Last Vitals:  Filed Vitals:   10/14/14 1301  BP: 152/86  Pulse: 75  Temp: 36.9 C  Resp: 18    Complications: No apparent anesthesia complications

## 2014-10-14 NOTE — Anesthesia Postprocedure Evaluation (Signed)
Anesthesia Post Note  Patient: Sheri Cole  Procedure(s) Performed: Procedure(s) (LRB): DILATATION & CURETTAGE/HYSTEROSCOPY WITH MYOSURE (N/A)  Anesthesia type: GA  Patient location: PACU  Post pain: Pain level controlled  Post assessment: Post-op Vital signs reviewed  Last Vitals:  Filed Vitals:   10/14/14 1446  BP: 127/78  Pulse: 68  Temp: 36.4 C  Resp: 16    Post vital signs: Reviewed  Level of consciousness: sedated  Complications: No apparent anesthesia complications

## 2014-10-14 NOTE — Anesthesia Preprocedure Evaluation (Signed)
Anesthesia Evaluation  Patient identified by MRN, date of birth, ID band Patient awake    Reviewed: Allergy & Precautions, H&P , Patient's Chart, lab work & pertinent test results, reviewed documented beta blocker date and time   History of Anesthesia Complications Negative for: history of anesthetic complications  Airway Mallampati: II  TM Distance: >3 FB Neck ROM: full    Dental   Pulmonary  breath sounds clear to auscultation        Cardiovascular Exercise Tolerance: Good Rhythm:regular Rate:Normal     Neuro/Psych negative psych ROS   GI/Hepatic   Endo/Other    Renal/GU      Musculoskeletal   Abdominal   Peds  Hematology   Anesthesia Other Findings   Reproductive/Obstetrics                             Anesthesia Physical Anesthesia Plan  ASA: II  Anesthesia Plan: General LMA   Post-op Pain Management:    Induction:   Airway Management Planned:   Additional Equipment:   Intra-op Plan:   Post-operative Plan:   Informed Consent: I have reviewed the patients History and Physical, chart, labs and discussed the procedure including the risks, benefits and alternatives for the proposed anesthesia with the patient or authorized representative who has indicated his/her understanding and acceptance.   Dental Advisory Given  Plan Discussed with: CRNA, Surgeon and Anesthesiologist  Anesthesia Plan Comments:         Anesthesia Quick Evaluation

## 2014-10-14 NOTE — Discharge Instructions (Signed)
DISCHARGE INSTRUCTIONS: D&C  The following instructions have been prepared to help you care for yourself upon your return home.  MAY TAKE IBUPROFEN (MOTRIN, ADVIL) OR ALEVE AFTER 8:30 PM FOR CRAMPS!! MAY TAKE OFF THE PATCH BEHIND YOUR EAR IN 2 DAYS!   Personal hygiene:  Use sanitary pads for vaginal drainage, not tampons.  Shower the day after your procedure.  NO tub baths, pools or Jacuzzis for 2-3 weeks.  Wipe front to back after using the bathroom.  Activity and limitations:  Do NOT drive or operate any equipment for 24 hours. The effects of anesthesia are still present and drowsiness may result.  Do NOT rest in bed all day.  Walking is encouraged.  Walk up and down stairs slowly.  You may resume your normal activity in one to two days or as indicated by your physician.  Sexual activity: NO intercourse for at least 2 weeks after the procedure, or as indicated by your physician.  Diet: Eat a light meal as desired this evening. You may resume your usual diet tomorrow.  Return to work: You may resume your work activities in one to two days or as indicated by your doctor.  What to expect after your surgery: Expect to have vaginal bleeding/discharge for 2-3 days and spotting for up to 10 days. It is not unusual to have soreness for up to 1-2 weeks. You may have a slight burning sensation when you urinate for the first day. Mild cramps may continue for a couple of days. You may have a regular period in 2-6 weeks.  Call your doctor for any of the following:  Excessive vaginal bleeding, saturating and changing one pad every hour.  Inability to urinate 6 hours after discharge from hospital.  Pain not relieved by pain medication.  Fever of 100.4 F or greater.  Unusual vaginal discharge or odor.   Call for an appointment:    Patients signature: ______________________  Nurses signature ________________________  Support person's signature_______________________

## 2014-10-14 NOTE — Op Note (Addendum)
10/14/2014  2:51 PM  PATIENT:  Sheri Cole  44 y.o. female  PRE-OPERATIVE DIAGNOSIS:  Endometrial Polyp  POST-OPERATIVE DIAGNOSIS:  Endometrial Polyp  PROCEDURE:  Procedure(s): DILATATION & CURETTAGE/HYSTEROSCOPY WITH MYOSURE (N/A)  polypectomy  SURGEON:  Surgeon(s) and Role:    * Ajanee Buren A. Pamala Hurry, MD - Primary  PHYSICIAN ASSISTANT:   ASSISTANTS: none   ANESTHESIA:   local and general  EBL:  Total I/O In: 1000 [I.V.:1000] Out: 30 [Urine:25; Blood:5]  BLOOD ADMINISTERED:none  DRAINS: none   LOCAL MEDICATIONS USED:  MARCAINE , 0.5% 20cc split at 5 and 7 o'clock to cervico-paracervical junction.  SPECIMEN:  Source of Specimen:  endometrial polyp, endometrial curretings  DISPOSITION OF SPECIMEN:  PATHOLOGY  COUNTS:  YES  TOURNIQUET:  * No tourniquets in log *  DICTATION: .Note written in EPIC  PLAN OF CARE: Discharge to home after PACU  PATIENT DISPOSITION:  PACU - hemodynamically stable.   Delay start of Pharmacological VTE agent (>24hrs) due to surgical blood loss or risk of bleeding: yes   Abx: none  Findings:  visualization of bilateral ostia, hemostasis post-procedure, 1.5 cm endometrial polyp from ant wall of uterus, fluffy endometrium throughout  Indications:  bleeding, endometrial polyp   After informed consent including discussion of risks of bleeding, infection, perforation,  the patient was taken to the operating room where general anesthesia was initiated without difficulty. She was prepped and draped in normal sterile fashion in the dorsal supine lithotomy position.  A bimanual examination was done to assess the size and position of the uterus. A speculum was placed in the vagina and single tooth tenaculum used to grasp the anterior lip of the cervix. Local anaesthetic was injected at 5 and 7 o'clock in there cervico-paracervical junction.   The cervix was not dilated. The 84mm scope was placed into the cervix and past the internal os.  Survey of  the endometrium/ pathology with findings as above. The Myosure blade was then placed through the operating channel, suction was applied and the polyp was serially grasped with the blade and morcellated.  A visual curretage was done on any areas with thicker polypoid tissue. The remainder of the uterus appeared normal. Hemostasis was noted.  The hysteroscope was then removed. Tenaculum was removed. The tenaculum site was hemostatic with pressure and silver nitrate and the case was terminated. The patient tolerated the procedure well. Sponge, lap and needle counts were correct and the patient was taken to the recovery room in stable condition.   Sheri Cole A. 10/14/2014 2:54 PM

## 2014-10-17 ENCOUNTER — Encounter (HOSPITAL_COMMUNITY): Payer: Self-pay | Admitting: Obstetrics

## 2015-07-13 ENCOUNTER — Ambulatory Visit (INDEPENDENT_AMBULATORY_CARE_PROVIDER_SITE_OTHER): Payer: BLUE CROSS/BLUE SHIELD | Admitting: Primary Care

## 2015-07-13 ENCOUNTER — Encounter: Payer: Self-pay | Admitting: Primary Care

## 2015-07-13 VITALS — BP 148/96 | HR 71 | Temp 97.6°F | Wt 213.1 lb

## 2015-07-13 DIAGNOSIS — R030 Elevated blood-pressure reading, without diagnosis of hypertension: Secondary | ICD-10-CM

## 2015-07-13 DIAGNOSIS — IMO0001 Reserved for inherently not codable concepts without codable children: Secondary | ICD-10-CM

## 2015-07-13 DIAGNOSIS — J029 Acute pharyngitis, unspecified: Secondary | ICD-10-CM | POA: Diagnosis not present

## 2015-07-13 DIAGNOSIS — B029 Zoster without complications: Secondary | ICD-10-CM

## 2015-07-13 DIAGNOSIS — I10 Essential (primary) hypertension: Secondary | ICD-10-CM | POA: Insufficient documentation

## 2015-07-13 MED ORDER — VALACYCLOVIR HCL 1 G PO TABS: 1000.0000 mg | ORAL_TABLET | Freq: Three times a day (TID) | ORAL | Status: DC

## 2015-07-13 NOTE — Assessment & Plan Note (Signed)
Elevated in clinic today. Also with elevated reading at company's health screening. Noted elevated reading in Epic from prior procedure in February 2016. Recommended she start antihypertensive medication for which she declines today. 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 Will continue to monitor and re-evaluate in 3 months.

## 2015-07-13 NOTE — Assessment & Plan Note (Signed)
>>  ASSESSMENT AND PLAN FOR ELEVATED BLOOD PRESSURE WRITTEN ON 07/13/2015 12:04 PM BY Pleas Koch, NP  Elevated in clinic today. Also with elevated reading at company's health screening. Noted elevated reading in Epic from prior procedure in February 2016. Recommended she start antihypertensive medication for which she declines today. 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 Will continue to monitor and re-evaluate in 3 months.

## 2015-07-13 NOTE — Progress Notes (Signed)
Pre visit review using our clinic review tool, if applicable. No additional management support is needed unless otherwise documented below in the visit note. 

## 2015-07-13 NOTE — Assessment & Plan Note (Signed)
Rash to right lateral frontal lobe/temporal lobe since Tuesday. Redness, tender, appears like shingles. Treat with Valtrex 1000 mg TID. Discussed timeline of shingles and will keep her out of work today and Friday due to "drainage" she noticed this morning.

## 2015-07-13 NOTE — Progress Notes (Signed)
Subjective:    Patient ID: Sheri Cole, female    DOB: [birth year], 44 y.o.   MRN: [id number]  HPI  Sheri Cole is a 44 year old female who presents today to establish care and discuss the problems mentioned below. Will obtain old records. Her last physical was in October 2015 per GYN. She is due to return later this year.  1) Elevated Blood Pressure Reading: Elevated in the clinic today and has had elevated readings for the past 2-3 months. 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 Her BP was checked in early October and was 130/90. She endorses normal readings with GYN. Her BP was 152/86 during her D&C procedure in February 2016. She denies chest pain and dizziness. She does have a headache.  2) Rash: Located to the right lateral forehead/temporal lobe. She first noticed symptoms of itching this Monday. She then noticed a rash on Tuesday. She scheduled an appointment with her dermatologist and will see them on November 14th. She endorses increased stress over the past several weeks. She reports a deep pain to her rash that is also tender upon palpation. She's noticed some "bumps" with drainage yesterday. Overall she's had increased pain and her rash is getting worse.   Review of Systems  Constitutional: Negative for unexpected weight change.  HENT: Negative for rhinorrhea.   Respiratory: Negative for cough and shortness of breath.   Cardiovascular: Negative for chest pain.  Gastrointestinal: Negative for diarrhea and constipation.  Genitourinary: Negative for difficulty urinating.       Regular periods  Musculoskeletal: Negative for myalgias and arthralgias.  Skin: Positive for rash.  Neurological: Positive for headaches. Negative for dizziness and numbness.  Psychiatric/Behavioral:       Denies concerns for anxiety and depression        Past Medical History  Diagnosis Date  . Medical history non-contributory   . Vaginal delivery 2005, 2008, 2010    Social  History   Social History  . Marital Status: Widowed    Spouse Name: N/A  . Number of Children: N/A  . Years of Education: N/A   Occupational History  . Not on file.   Social History Main Topics  . Smoking status: Never Smoker   . Smokeless tobacco: Never Used  . Alcohol Use: 0.0 oz/week    0 Standard drinks or equivalent per week     Comment: occasionally  . Drug Use: No  . Sexual Activity: Not on file   Other Topics Concern  . Not on file   Social History Narrative   Husband committed suicide 12/2012   Lives with children (51yo, 6yo, 27yo)   Work's for [employer].   Enjoys reading, spending time with family.    Past Surgical History  Procedure Laterality Date  . No past surgeries    . Dilatation & curettage/hysteroscopy with myosure N/A 10/14/2014    Procedure: DILATATION & CURETTAGE/HYSTEROSCOPY WITH MYOSURE;  Surgeon: Floyce Stakes. Pamala Hurry, MD;  Location: [facility];  Service: Gynecology;  Laterality: N/A;    Family History  Problem Relation Age of Onset  . Cancer Father     metastatic lung (smoker)  . Cancer Maternal Grandmother     breast, leukemia  . Cancer Paternal Grandmother     ovarian or cervical  . Diabetes Father   . Hypertension Neg Hx   . CAD Neg Hx   . Stroke Neg Hx     Allergies  Allergen Reactions  . Sulfonamide Derivatives  Hives    Current Outpatient Prescriptions on File Prior to Visit  Medication Sig Dispense Refill  . ibuprofen (ADVIL,MOTRIN) 800 MG tablet Take 1 tablet (800 mg total) by mouth every 8 (eight) hours as needed for mild pain. (Patient not taking: Reported on 07/13/2015) 60 tablet 1   No current facility-administered medications on file prior to visit.    BP 148/96 mmHg  Pulse 71  Temp(Src) 97.6 F (36.4 C) (Oral)  Wt 213 lb 1.9 oz (96.671 kg)  SpO2 98%  LMP 07/09/2015    Objective:   Physical Exam  Constitutional: She appears well-nourished.  Cardiovascular: Normal rate and regular rhythm.   Pulmonary/Chest: Effort  normal and breath sounds normal.  Skin: Skin is warm and dry. Rash noted. There is erythema.  Shingles-like appearing rash to right lateral frontal lobe/temporal lobe. Small vesicles noted without crusting or drainage. Tender.  Psychiatric: She has a normal mood and affect.          Assessment & Plan:

## 2015-07-13 NOTE — Patient Instructions (Signed)
Start Valacyclovir 1000 mg tablets for shingles. Take 1 tablet by mouth three times daily for 7 days.  Please schedule a follow up appointment in 3 months for re-evaluation of blood pressure.  It was a pleasure to meet you today! Please don't hesitate to call me with any questions. Welcome to [facility]!  Shingles Shingles, which is also known as herpes zoster, is an infection that causes a painful skin rash and fluid-filled blisters. Shingles is not related to genital herpes, which is a sexually transmitted infection.   Shingles only develops in people who:  Have had chickenpox.  Have received the chickenpox vaccine. (This is rare.) CAUSES Shingles is caused by varicella-zoster virus (VZV). This is the same virus that causes chickenpox. After exposure to VZV, the virus stays in the body in an inactive (dormant) state. Shingles develops if the virus reactivates. This can happen many years after the initial exposure to VZV. It is not known what causes this virus to reactivate. RISK FACTORS People who have had chickenpox or received the chickenpox vaccine are at risk for shingles. Infection is more common in people who:  Are older than age 43.  Have a weakened defense (immune) system, such as those with HIV, AIDS, or cancer.  Are taking medicines that weaken the immune system, such as transplant medicines.  Are under great stress. SYMPTOMS Early symptoms of this condition include itching, tingling, and pain in an area on your skin. Pain may be described as burning, stabbing, or throbbing. A few days or weeks after symptoms start, a painful red rash appears, usually on one side of the body in a bandlike or beltlike pattern. The rash eventually turns into fluid-filled blisters that break open, scab over, and dry up in about 2-3 weeks. At any time during the infection, you may also develop:  A fever.  Chills.  A headache.  An upset stomach. DIAGNOSIS This condition is diagnosed with  a skin exam. Sometimes, skin or fluid samples are taken from the blisters before a diagnosis is made. These samples are examined under a microscope or sent to a lab for testing. TREATMENT There is no specific cure for this condition. Your health care provider will probably prescribe medicines to help you manage pain, recover more quickly, and avoid long-term problems. Medicines may include:  Antiviral drugs.  Anti-inflammatory drugs.  Pain medicines. If the area involved is on your face, you may be referred to a specialist, such as an eye doctor (ophthalmologist) or an ear, nose, and throat (ENT) doctor to help you avoid eye problems, chronic pain, or disability. HOME CARE INSTRUCTIONS Medicines  Take medicines only as directed by your health care provider.  Apply an anti-itch or numbing cream to the affected area as directed by your health care provider. Blister and Rash Care  Take a cool bath or apply cool compresses to the area of the rash or blisters as directed by your health care provider. This may help with pain and itching.  Keep your rash covered with a loose bandage (dressing). Wear loose-fitting clothing to help ease the pain of material rubbing against the rash.  Keep your rash and blisters clean with mild soap and cool water or as directed by your health care provider.  Check your rash every day for signs of infection. These include redness, swelling, and pain that lasts or increases.  Do not pick your blisters.  Do not scratch your rash. General Instructions  Rest as directed by your health care provider.  Keep all follow-up visits as directed by your health care provider. This is important.  Until your blisters scab over, your infection can cause chickenpox in people who have never had it or been vaccinated against it. To prevent this from happening, avoid contact with other people, especially:  Babies.  Pregnant women.  Children who have eczema.  Elderly  people who have transplants.  People who have chronic illnesses, such as leukemia or AIDS. SEEK MEDICAL CARE IF:  Your pain is not relieved with prescribed medicines.  Your pain does not get better after the rash heals.  Your rash looks infected. Signs of infection include redness, swelling, and pain that lasts or increases. SEEK IMMEDIATE MEDICAL CARE IF:  The rash is on your face or nose.  You have facial pain, pain around your eye area, or loss of feeling on one side of your face.  You have ear pain or you have ringing in your ear.  You have loss of taste.  Your condition gets worse.   This information is not intended to replace advice given to you by your health care provider. Make sure you discuss any questions you have with your health care provider.   Document Released: 08/26/2005 Document Revised: 09/16/2014 Document Reviewed: 07/07/2014 Elsevier Interactive Patient Education [employer].

## 2015-07-16 ENCOUNTER — Encounter: Payer: Self-pay | Admitting: Primary Care

## 2015-07-24 ENCOUNTER — Other Ambulatory Visit: Payer: Self-pay | Admitting: Dermatology

## 2015-07-26 ENCOUNTER — Other Ambulatory Visit: Payer: Self-pay

## 2015-07-26 DIAGNOSIS — Z1231 Encounter for screening mammogram for malignant neoplasm of breast: Secondary | ICD-10-CM

## 2016-01-24 ENCOUNTER — Other Ambulatory Visit: Payer: Self-pay | Admitting: Dermatology

## 2016-01-24 DIAGNOSIS — D485 Neoplasm of uncertain behavior of skin: Secondary | ICD-10-CM | POA: Diagnosis not present

## 2016-01-24 DIAGNOSIS — D239 Other benign neoplasm of skin, unspecified: Secondary | ICD-10-CM | POA: Diagnosis not present

## 2016-01-24 DIAGNOSIS — Z1321 Encounter for screening for nutritional disorder: Secondary | ICD-10-CM | POA: Diagnosis not present

## 2016-01-24 DIAGNOSIS — E119 Type 2 diabetes mellitus without complications: Secondary | ICD-10-CM | POA: Diagnosis not present

## 2016-01-24 DIAGNOSIS — Z1151 Encounter for screening for human papillomavirus (HPV): Secondary | ICD-10-CM | POA: Diagnosis not present

## 2016-01-24 DIAGNOSIS — L570 Actinic keratosis: Secondary | ICD-10-CM | POA: Diagnosis not present

## 2016-01-24 DIAGNOSIS — Z1159 Encounter for screening for other viral diseases: Secondary | ICD-10-CM | POA: Diagnosis not present

## 2016-01-24 DIAGNOSIS — Z0000 Encounter for general adult medical examination without abnormal findings: Secondary | ICD-10-CM | POA: Diagnosis not present

## 2016-01-24 DIAGNOSIS — Z6833 Body mass index (BMI) 33.0-33.9, adult: Secondary | ICD-10-CM | POA: Diagnosis not present

## 2016-01-24 DIAGNOSIS — Z113 Encounter for screening for infections with a predominantly sexual mode of transmission: Secondary | ICD-10-CM | POA: Diagnosis not present

## 2016-01-24 DIAGNOSIS — Z13220 Encounter for screening for lipoid disorders: Secondary | ICD-10-CM | POA: Diagnosis not present

## 2016-01-24 DIAGNOSIS — Z01419 Encounter for gynecological examination (general) (routine) without abnormal findings: Secondary | ICD-10-CM | POA: Diagnosis not present

## 2016-01-24 DIAGNOSIS — Z1329 Encounter for screening for other suspected endocrine disorder: Secondary | ICD-10-CM | POA: Diagnosis not present

## 2016-01-24 DIAGNOSIS — Z1231 Encounter for screening mammogram for malignant neoplasm of breast: Secondary | ICD-10-CM | POA: Diagnosis not present

## 2016-01-24 DIAGNOSIS — Z130 Encounter for screening for diseases of the blood and blood-forming organs and certain disorders involving the immune mechanism: Secondary | ICD-10-CM | POA: Diagnosis not present

## 2016-10-30 DIAGNOSIS — M7712 Lateral epicondylitis, left elbow: Secondary | ICD-10-CM | POA: Diagnosis not present

## 2017-02-26 DIAGNOSIS — M79641 Pain in right hand: Secondary | ICD-10-CM | POA: Diagnosis not present

## 2017-03-04 DIAGNOSIS — D229 Melanocytic nevi, unspecified: Secondary | ICD-10-CM | POA: Diagnosis not present

## 2017-04-30 ENCOUNTER — Encounter: Payer: Self-pay | Admitting: Family Medicine

## 2017-04-30 DIAGNOSIS — R030 Elevated blood-pressure reading, without diagnosis of hypertension: Secondary | ICD-10-CM | POA: Diagnosis not present

## 2017-04-30 DIAGNOSIS — Z113 Encounter for screening for infections with a predominantly sexual mode of transmission: Secondary | ICD-10-CM | POA: Diagnosis not present

## 2017-04-30 DIAGNOSIS — Z13220 Encounter for screening for lipoid disorders: Secondary | ICD-10-CM | POA: Diagnosis not present

## 2017-04-30 DIAGNOSIS — Z118 Encounter for screening for other infectious and parasitic diseases: Secondary | ICD-10-CM | POA: Diagnosis not present

## 2017-04-30 DIAGNOSIS — Z1329 Encounter for screening for other suspected endocrine disorder: Secondary | ICD-10-CM | POA: Diagnosis not present

## 2017-04-30 DIAGNOSIS — Z1231 Encounter for screening mammogram for malignant neoplasm of breast: Secondary | ICD-10-CM | POA: Diagnosis not present

## 2017-04-30 DIAGNOSIS — Z1159 Encounter for screening for other viral diseases: Secondary | ICD-10-CM | POA: Diagnosis not present

## 2017-04-30 DIAGNOSIS — F321 Major depressive disorder, single episode, moderate: Secondary | ICD-10-CM | POA: Diagnosis not present

## 2017-04-30 DIAGNOSIS — Z833 Family history of diabetes mellitus: Secondary | ICD-10-CM | POA: Diagnosis not present

## 2017-04-30 DIAGNOSIS — Z6832 Body mass index (BMI) 32.0-32.9, adult: Secondary | ICD-10-CM | POA: Diagnosis not present

## 2017-04-30 DIAGNOSIS — Z130 Encounter for screening for diseases of the blood and blood-forming organs and certain disorders involving the immune mechanism: Secondary | ICD-10-CM | POA: Diagnosis not present

## 2017-04-30 DIAGNOSIS — Z01419 Encounter for gynecological examination (general) (routine) without abnormal findings: Secondary | ICD-10-CM | POA: Diagnosis not present

## 2017-04-30 LAB — HEPATIC FUNCTION PANEL
ALK PHOS: 34 (ref 25–125)
ALT: 14 (ref 7–35)
AST: 16 (ref 13–35)
BILIRUBIN, TOTAL: 0.5

## 2017-04-30 LAB — LIPID PANEL
Cholesterol: 212 — AB (ref 0–200)
HDL: 59 (ref 35–70)
LDL Cholesterol: 139
TRIGLYCERIDES: 70 (ref 40–160)

## 2017-04-30 LAB — BASIC METABOLIC PANEL
BUN: 11 (ref 4–21)
CREATININE: 0.8 (ref 0.5–1.1)
Glucose: 90
Potassium: 4.1 (ref 3.4–5.3)
SODIUM: 140 (ref 137–147)

## 2017-04-30 LAB — CBC AND DIFFERENTIAL
HCT: 42 (ref 36–46)
Hemoglobin: 14.4 (ref 12.0–16.0)
PLATELETS: 311 (ref 150–399)
WBC: 6.8

## 2017-04-30 LAB — HM HEPATITIS C SCREENING LAB: HM Hepatitis Screen: NEGATIVE

## 2017-04-30 LAB — TSH: TSH: 1.79 (ref 0.41–5.90)

## 2017-11-25 ENCOUNTER — Encounter: Payer: Self-pay | Admitting: Family Medicine

## 2017-11-25 DIAGNOSIS — R350 Frequency of micturition: Secondary | ICD-10-CM | POA: Diagnosis not present

## 2017-11-25 DIAGNOSIS — N760 Acute vaginitis: Secondary | ICD-10-CM | POA: Diagnosis not present

## 2017-11-25 DIAGNOSIS — R300 Dysuria: Secondary | ICD-10-CM | POA: Diagnosis not present

## 2017-12-15 ENCOUNTER — Ambulatory Visit (INDEPENDENT_AMBULATORY_CARE_PROVIDER_SITE_OTHER): Payer: BLUE CROSS/BLUE SHIELD | Admitting: Family Medicine

## 2017-12-15 ENCOUNTER — Encounter: Payer: Self-pay | Admitting: Family Medicine

## 2017-12-15 ENCOUNTER — Ambulatory Visit (INDEPENDENT_AMBULATORY_CARE_PROVIDER_SITE_OTHER)
Admission: RE | Admit: 2017-12-15 | Discharge: 2017-12-15 | Disposition: A | Discharge status: Home / Self Care | Payer: BLUE CROSS/BLUE SHIELD | Source: Ambulatory Visit | Attending: Family Medicine | Admitting: Family Medicine

## 2017-12-15 ENCOUNTER — Other Ambulatory Visit: Payer: Self-pay

## 2017-12-15 ENCOUNTER — Ambulatory Visit (HOSPITAL_COMMUNITY)
Admission: RE | Admit: 2017-12-15 | Discharge: 2017-12-15 | Disposition: A | Discharge status: Home / Self Care | Payer: BLUE CROSS/BLUE SHIELD | Source: Ambulatory Visit | Attending: Family Medicine | Admitting: Family Medicine

## 2017-12-15 VITALS — BP 140/90 | HR 81 | Temp 98.3°F | Ht 67.5 in | Wt 219.2 lb

## 2017-12-15 DIAGNOSIS — M5126 Other intervertebral disc displacement, lumbar region: Secondary | ICD-10-CM | POA: Insufficient documentation

## 2017-12-15 DIAGNOSIS — M545 Low back pain: Secondary | ICD-10-CM | POA: Diagnosis not present

## 2017-12-15 DIAGNOSIS — M48061 Spinal stenosis, lumbar region without neurogenic claudication: Secondary | ICD-10-CM | POA: Diagnosis not present

## 2017-12-15 DIAGNOSIS — M5127 Other intervertebral disc displacement, lumbosacral region: Secondary | ICD-10-CM | POA: Insufficient documentation

## 2017-12-15 DIAGNOSIS — R6889 Other general symptoms and signs: Secondary | ICD-10-CM

## 2017-12-15 DIAGNOSIS — M5416 Radiculopathy, lumbar region: Secondary | ICD-10-CM

## 2017-12-15 DIAGNOSIS — R200 Anesthesia of skin: Secondary | ICD-10-CM | POA: Diagnosis not present

## 2017-12-15 DIAGNOSIS — R202 Paresthesia of skin: Secondary | ICD-10-CM

## 2017-12-15 MED ORDER — GADOBENATE DIMEGLUMINE 529 MG/ML IV SOLN
20.0000 mL | Freq: Once | INTRAVENOUS | Status: AC | PRN
  Administered 2017-12-15: 20 mL via INTRAVENOUS

## 2017-12-15 NOTE — Progress Notes (Signed)
Dr. Frederico Hamman T. Aarion Metzgar, MD, [facility] [place], [address] Phone: [phone] Fax: [phone]  12/15/2017  Patient: Sheri Cole, MRN: [id number], DOB: 01/13/1971, 47 y.o.  Primary Physician:  Pleas Koch, NP   Chief Complaint  Patient presents with  . Numbness    from waist down-started around March 10   Subjective:   Sheri Cole is a 47 y.o. very pleasant female patient who presents with the following:  This 47 year old healthy lady is a patient of our practice, but she is a new patient to me.  She has not been seen here in our office in approximately 2 1/2 years.  She primarily comes in with a complaint of bilateral leg numbness, which has transitioned to numbness on the left.  She also has got genital numbness around the vagina as well as the anus.  She also has saddle anesthesia.  She also describes some difficulty with defecating and urinating, but she denies frank incontinence.  She states that bowel movements feel strange and she has a sensation of feeling full.  Approximately around March 10, the patient did have some back pain, but this has overall resolved, and the persistent feeling has been the numbness.  She has had some occasional intermittent tingling.  She has not had any focal weakness.  She has no history of trauma and no prior history of any back problems of any significance, and she has had no history of significant back surgery.  Started to feel weird - back hurt before her period. Tried to stretch her back with numbness and numb on both legs.   Had some difficulty defecating and urinating. Bowel movements feel strange and feeling full.   Tested for UTI at GYN within the last 2 weeks. Went on Suprax - Urine culture was negative. Symptoms have persisted. Now has numbness only on the left. Includes the labia and the anus.   Lateral L leg dec sensation and foot dtr 2+  Past  Medical History, Surgical History, Social History, Family History, Problem List, Medications, and Allergies have been reviewed and updated if relevant.  Patient Active Problem List   Diagnosis Date Noted  . Elevated blood pressure 07/13/2015  . Herpes zoster 07/13/2015    Past Medical History:  Diagnosis Date  . Endometrial polyp 2016  . Vaginal delivery 2005, 2008, 2010    Past Surgical History:  Procedure Laterality Date  . DILATATION & CURETTAGE/HYSTEROSCOPY WITH MYOSURE N/A 10/14/2014   Procedure: DILATATION & CURETTAGE/HYSTEROSCOPY WITH MYOSURE;  Surgeon: Floyce Stakes. Pamala Hurry, MD;  Location: [facility];  Service: Gynecology;  Laterality: N/A;  . NO PAST SURGERIES      Social History   Socioeconomic History  . Marital status: Widowed    Spouse name: Not on file  . Number of children: Not on file  . Years of education: Not on file  . Highest education level: Not on file  Occupational History  . Not on file  Social Needs  . Financial resource strain: Not on file  . Food insecurity:    Worry: Not on file    Inability: Not on file  . Transportation needs:    Medical: Not on file    Non-medical: Not on file  Tobacco Use  . Smoking status: Never Smoker  . Smokeless tobacco: Never Used  Substance and Sexual Activity  . Alcohol use: Yes    Alcohol/week: 0.0 oz    Comment: occasionally  .  Drug use: No  . Sexual activity: Not on file  Lifestyle  . Physical activity:    Days per week: Not on file    Minutes per session: Not on file  . Stress: Not on file  Relationships  . Social connections:    Talks on phone: Not on file    Gets together: Not on file    Attends religious service: Not on file    Active member of club or organization: Not on file    Attends meetings of clubs or organizations: Not on file    Relationship status: Not on file  . Intimate partner violence:    Fear of current or ex partner: Not on file    Emotionally abused: Not on file    Physically abused:  Not on file    Forced sexual activity: Not on file  Other Topics Concern  . Not on file  Social History Narrative   Husband committed suicide 12/2012   Lives with children (93yo, 6yo, 28yo)   Work's for [employer].   Enjoys reading, spending time with family.    Family History  Problem Relation Age of Onset  . Cancer Father        metastatic lung (smoker)  . Cancer Maternal Grandmother        breast, leukemia  . Cancer Paternal Grandmother        ovarian or cervical  . Diabetes Father   . Hypertension Neg Hx   . CAD Neg Hx   . Stroke Neg Hx     Allergies  Allergen Reactions  . Sulfonamide Derivatives Hives    Medication list reviewed and updated in full in [place].   GEN: No acute illnesses, no fevers, chills. GI: No n/v/d, eating normally Pulm: No SOB Ortho and neuro are as above Interactive and getting along well at home. Otherwise, the pertinent positives and negatives are listed above and in the HPI, otherwise a full review of systems has been reviewed and is negative unless noted positive.   Objective:   BP 140/90   Pulse 81   Temp 98.3 F (36.8 C) (Oral)   Ht 5' 7.5" (1.715 m)   Wt 219 lb 4 oz (99.5 kg)   LMP 11/18/2017   BMI 33.83 kg/m    GEN: Well-developed,well-nourished,in no acute distress; alert,appropriate and cooperative throughout examination HEENT: Normocephalic and atraumatic without obvious abnormalities. Ears, externally no deformities CV: RRR, no m/g/r  PULM: Normal respiratory rate, no accessory muscle use. No wheezes, crackles or rhonchi  ABD: S, NT, ND, + BS, No rebound, No HSM  EXT: No clubbing, cyanosis, or edema PSYCH: Normally interactive. Cooperative during the interview. Pleasant. Friendly and conversant. Not anxious or depressed appearing. Normal, full affect.  This portion of the physical examination was chaperoned by Hedy Camara, CMA.   GU and Rectal: Externally the skin tissue all appears normal as well as the labia  majora as well as the introitus.  The anus also appears normal externally and all of the skin around in this area.  On exam, there is decidedly decreased sensation on the lateral aspect of the buttocks on the left side, approximately two thirds of the buttocks in total.  This includes the left lateral aspects of the anus, which has decreased sensation.  On the right side of the buttocks, there is relatively full sensation with some modest decrease in the lowest aspect of the buttocks region, approaching the perineal region.  At the anus itself  there is relatively increased sensation compared to the left, but decreased compared to what would be expected at baseline.  On rectal exam, the patient does appear to have decreased sensation modestly decreased tone compared to what would be expected.  She does have some sensation that rectal exam is occurring.  On vaginal exam, the labia majora and labia minora have decreased sensation.  Decreased sensation in the perineal region also.  Range of motion at  the waist: Flexion, extension, lateral bending and rotation: Full flexion, extension, lateral bending as well as rotation.  No echymosis or edema Rises to examination table with mild difficulty Gait: minimally antalgic  Inspection/Deformity: N Paraspinus Tenderness: There is not appreciable.  B Ankle Dorsiflexion (L5,4): 5/5 B Great Toe Dorsiflexion (L5,4): 5/5 Heel Walk (L5): WNL Toe Walk (S1): WNL Rise/Squat (L4): WNL, mild pain  SENSORY B Medial Foot (L4): WNL B Dorsum (L5): decreased on L B Lateral (S1): decreased on L On the left leg on the entirety of the lateral aspect of the lower leg there is decreased sensation to soft touch as well as pinprick.  This is greater on the lateral aspect of the foot, 1st webspace, all to soft touch and pinprick.  REFLEXES Knee (L4): 2+ Ankle (S1): 2+  B SLR, seated: neg B SLR, supine: neg B FABER: neg B Reverse FABER: neg B Greater Troch: NT B  Log Roll: neg B Stork: NT B Sciatic Notch: NT   Laboratory and Imaging Data: Dg Lumbar Spine Complete  Result Date: 12/15/2017 CLINICAL DATA:  Lumbar back pain and left leg numbness. EXAM: LUMBAR SPINE - COMPLETE 4+ VIEW COMPARISON:  None. FINDINGS: There is no evidence of lumbar spine fracture. Alignment is normal. Intervertebral disc spaces are maintained. No evidence of facet arthropathy or other bone lesions. IMPRESSION: Negative. Electronically Signed   By: Earle Gell M.D.   On: 12/15/2017 19:44   Mr Lumbar Spine W Wo Contrast  Result Date: 12/15/2017 CLINICAL DATA:  Back pain for several weeks, LEFT leg and genital numbness. Suspect cauda equina. EXAM: MRI LUMBAR SPINE WITHOUT AND WITH CONTRAST TECHNIQUE: Multiplanar and multiecho pulse sequences of the lumbar spine were obtained without and with intravenous contrast. CONTRAST:  49mL MULTIHANCE GADOBENATE DIMEGLUMINE 529 MG/ML IV SOLN COMPARISON:  Lumbar spine radiographs December 15, 2017 FINDINGS: SEGMENTATION: For the purposes of this report, the last well-formed intervertebral disc is reported as L5-S1. ALIGNMENT: Maintained lumbar lordosis. No malalignment. VERTEBRAE: Vertebral bodies are intact. Intervertebral discs demonstrate normal morphology, mild L4-5 and L5-S1 disc desiccation. No abnormal or acute bone marrow signal. No abnormal osseous or intradiscal enhancement. CONUS MEDULLARIS AND CAUDA EQUINA: Conus medullaris terminates at T12-L1 and demonstrates normal morphology and signal characteristics. Cauda equina is normal. No abnormal cord, leptomeningeal or epidural enhancement. PARASPINAL AND OTHER SOFT TISSUES: Included prevertebral and paraspinal soft tissues are normal. DISC LEVELS: T12-L1 and L1-2: No disc bulge, canal stenosis nor neural foraminal narrowing. L2-3: Small RIGHT subarticular disc protrusion encroaches upon the traversing L3 nerve within lateral recess. Enhancing annular fissure. No canal stenosis or neural foraminal  narrowing. L3-4: No disc bulge, canal stenosis nor neural foraminal narrowing. L4-5: Small LEFT extraforaminal disc protrusion with enhancing annular fissure. Encroachment upon the exited LEFT L4 nerve. Mild facet arthropathy and ligamentum flavum redundancy without canal stenosis. Mild LEFT neural foraminal narrowing. L5-S1: Small LEFT central disc protrusion with enhancing annular fissure slightly posterior displaces the traversing LEFT S1 nerve within the lateral recess. Minimal facet arthropathy without canal stenosis or neural  foraminal narrowing. IMPRESSION: 1. No fracture or malalignment. Multilevel enhancing annular fissures. 2. Small L5-S1 disc protrusion resulting in LEFT S1 nerve impingement. 3. Small L2-3 and L4-5 disc protrusions encroach upon the traversing RIGHT L3 and exited LEFT L4 nerves respectively. 4. No canal stenosis.  Mild LEFT L4-5 neural foraminal narrowing. Electronically Signed   By: Elon Alas M.D.   On: 12/15/2017 19:13     Assessment and Plan:   Saddle anesthesia - Plan: MR Lumbar Spine W Wo Contrast, DG Lumbar Spine Complete, Ambulatory referral to Neurosurgery  Lumbar back pain with radiculopathy affecting left lower extremity - Plan: MR Lumbar Spine W Wo Contrast, DG Lumbar Spine Complete, Ambulatory referral to Neurosurgery  Numbness and tingling - Plan: MR Lumbar Spine W Wo Contrast, DG Lumbar Spine Complete, Ambulatory referral to Neurosurgery  Abnormal digital rectal exam - Plan: MR Lumbar Spine W Wo Contrast, DG Lumbar Spine Complete, Ambulatory referral to Neurosurgery   >40 minutes spent in face to face time with patient, >50% spent in counselling or coordination of care   Given history and exam findings with abnormal sensation, saddle anesthesia, as well as decreased sensation around the genitourinary region as well as the anus, obtain an emergent MRI of the lumbar spine with and without contrast to evaluate for potential cauda equina.  Differential  diagnosis would also include neoplasm or other compressive lesion.  At this point in time, these findings of MRI have returned, and she does not have cauda equina.  She does have multiple compressive lesions on the lower spine as well at L5-S1, which could potentially explain this.  All this has been discussed with the patient verbally.  I do think that she needs a higher level of care, and I am going to place an urgent neurosurgical consultation for their opinion.  Follow-up: No follow-ups on file.  Medications Discontinued During This Encounter  Medication Reason  . ibuprofen (ADVIL,MOTRIN) 800 MG tablet Completed Course  . valACYclovir (VALTREX) 1000 MG tablet Completed Course   Orders Placed This Encounter  Procedures  . MR Lumbar Spine W Wo Contrast  . DG Lumbar Spine Complete    Signed,  Lashonta Pilling T. Shikira Folino, MD   Allergies as of 12/15/2017      Reactions   Sulfonamide Derivatives Hives      Medication List        Accurate as of 12/15/17 11:59 PM. Always use your most recent med list.          BIOTIN PO Take 2 tablets by mouth daily.   CALCIUM PO Take 1 tablet by mouth 2 (two) times daily.

## 2017-12-15 NOTE — Patient Instructions (Signed)

## 2017-12-17 ENCOUNTER — Ambulatory Visit: Payer: BLUE CROSS/BLUE SHIELD | Admitting: Primary Care

## 2017-12-19 DIAGNOSIS — M5126 Other intervertebral disc displacement, lumbar region: Secondary | ICD-10-CM | POA: Diagnosis not present

## 2017-12-19 DIAGNOSIS — R030 Elevated blood-pressure reading, without diagnosis of hypertension: Secondary | ICD-10-CM | POA: Diagnosis not present

## 2017-12-19 DIAGNOSIS — Z6834 Body mass index (BMI) 34.0-34.9, adult: Secondary | ICD-10-CM | POA: Diagnosis not present

## 2017-12-31 DIAGNOSIS — M5126 Other intervertebral disc displacement, lumbar region: Secondary | ICD-10-CM | POA: Diagnosis not present

## 2017-12-31 DIAGNOSIS — R030 Elevated blood-pressure reading, without diagnosis of hypertension: Secondary | ICD-10-CM | POA: Diagnosis not present

## 2017-12-31 DIAGNOSIS — Z6834 Body mass index (BMI) 34.0-34.9, adult: Secondary | ICD-10-CM | POA: Diagnosis not present

## 2018-01-26 ENCOUNTER — Encounter: Payer: Self-pay | Admitting: Family Medicine

## 2018-01-26 ENCOUNTER — Ambulatory Visit (INDEPENDENT_AMBULATORY_CARE_PROVIDER_SITE_OTHER): Payer: BLUE CROSS/BLUE SHIELD | Admitting: Family Medicine

## 2018-01-26 VITALS — BP 147/92 | HR 60 | Ht 67.5 in | Wt 220.5 lb

## 2018-01-26 DIAGNOSIS — E785 Hyperlipidemia, unspecified: Secondary | ICD-10-CM | POA: Insufficient documentation

## 2018-01-26 DIAGNOSIS — Z8679 Personal history of other diseases of the circulatory system: Secondary | ICD-10-CM

## 2018-01-26 DIAGNOSIS — Z833 Family history of diabetes mellitus: Secondary | ICD-10-CM

## 2018-01-26 DIAGNOSIS — Z803 Family history of malignant neoplasm of breast: Secondary | ICD-10-CM | POA: Diagnosis not present

## 2018-01-26 DIAGNOSIS — F341 Dysthymic disorder: Secondary | ICD-10-CM | POA: Insufficient documentation

## 2018-01-26 DIAGNOSIS — Z8639 Personal history of other endocrine, nutritional and metabolic disease: Secondary | ICD-10-CM

## 2018-01-26 DIAGNOSIS — R030 Elevated blood-pressure reading, without diagnosis of hypertension: Secondary | ICD-10-CM | POA: Diagnosis not present

## 2018-01-26 DIAGNOSIS — Z634 Disappearance and death of family member: Secondary | ICD-10-CM | POA: Insufficient documentation

## 2018-01-26 DIAGNOSIS — M5126 Other intervertebral disc displacement, lumbar region: Secondary | ICD-10-CM | POA: Diagnosis not present

## 2018-01-26 DIAGNOSIS — G8929 Other chronic pain: Secondary | ICD-10-CM | POA: Diagnosis not present

## 2018-01-26 DIAGNOSIS — M47817 Spondylosis without myelopathy or radiculopathy, lumbosacral region: Secondary | ICD-10-CM | POA: Diagnosis not present

## 2018-01-26 DIAGNOSIS — E669 Obesity, unspecified: Secondary | ICD-10-CM | POA: Diagnosis not present

## 2018-01-26 DIAGNOSIS — Z801 Family history of malignant neoplasm of trachea, bronchus and lung: Secondary | ICD-10-CM

## 2018-01-26 DIAGNOSIS — Z8659 Personal history of other mental and behavioral disorders: Secondary | ICD-10-CM | POA: Insufficient documentation

## 2018-01-26 DIAGNOSIS — M5442 Lumbago with sciatica, left side: Secondary | ICD-10-CM | POA: Diagnosis not present

## 2018-01-26 DIAGNOSIS — E66811 Obesity, class 1: Secondary | ICD-10-CM

## 2018-01-26 DIAGNOSIS — M5136 Other intervertebral disc degeneration, lumbar region with discogenic back pain only: Secondary | ICD-10-CM

## 2018-01-26 NOTE — Progress Notes (Signed)
New patient office visit note:  Impression and Recommendations:    1. Elevated blood pressure reading   2. History of hypertension   3. Family history of diabetes mellitus in father- in 62's; TypeI   4. Obesity, Class I, BMI 30-34.9   5. History of hyperlipidemia   6. History of vitamin D deficiency   7. Family history of lung cancer-father died age 46 history of heavy smoking   8. Family history of breast cancer in female   48. Chronic left-sided low back pain with left-sided sciatica   10. Facet arthropathy, lumbosacral   11. Lumbar discogenic pain syndrome     - Blood pressure not at goal of 119/79 or less.  Told patient to monitor it at home-keep a log and bring in next office visit.  She will check her blood pressures daily.  We briefly discussed weight loss, low-salt diet and increase activity levels.  - We will obtain A1c and future labs since patient is high risk due to family history and current BMI.  - elevated bmi: Counseling done on normal BMI and increasing BMI increasing her risk for disease.    - We will obtain fasting lipid profile due to her history of hyperlipidemia.  Did discuss with patient briefly low saturated and trans-fat diet as well as increase activity to AHA guidelines  - Patient will continue to follow-up with GYN for her routine female care.   important she get [place] results of Pap smears, mammograms etc.   - For her chronic low back pain which she is followed and txed by Dr. Trenton Gammon.  Advised patient to please get [place] those medical records so we can have them for her chart.  Patient aware of red flag symptoms and if she has loss of bowel or bladder along with increasing low back pain, this is a surgical emergency and she is to proceed to the emergency room immediately.     Education and routine counseling performed. Handouts provided.  Orders Placed This Encounter  Procedures  . Comprehensive metabolic panel  . Hemoglobin A1c  . Lipid panel  . TSH   . VITAMIN D 25 Hydroxy (Vit-D Deficiency, Fractures)  . CBC with Differential/Platelet  . T4, free    Gross side effects, risk and benefits, and alternatives of medications discussed with patient.  Patient is aware that all medications have potential side effects and we are unable to predict every side effect or drug-drug interaction that may occur.  Expresses verbal understanding and consents to current therapy plan and treatment regimen.  Return for Chronic OV w me 4wks- BP reck and go over labs & FBW 2-3d prior.  Please see AVS handed out to patient at the end of our visit for further patient instructions/ counseling done pertaining to today's office visit.    Note: This document was prepared using Dragon voice recognition software and may include unintentional dictation errors.     Mellody Dance 01/26/18 12:17 PM   ----------------------------------------------------------------------------------------------------------------------    Subjective:    Chief complaint:   Chief Complaint  Patient presents with  . Establish Care     HPI: Sheri Cole is a pleasant 47 y.o. female who presents to [place] [facility] today to review their medical history with me and establish care.   I asked the patient to review their chronic problem list with me to ensure everything was updated and accurate.    All recent office visits with other  providers, any medical records that patient brought in etc  - I reviewed today.     We asked pt to get [place] their medical records from [facility] providers/ specialists that they had seen within the past 3-5 years- if they are in private practice and/or do not work for [employer], [facility], [place], [place] or [employer] owned practice.  Told them to call their specialists to clarify this if they are not sure.    -Patient seen by Dr. Valentino Saxon of [facility].  This is where she gets all of her female care from.  Patient has 3  daughters ages 50, 51 and 55.  She is widowed.  Approximately 5 years ago her husband committed suicide.    They had a very strained relationship and she and her children are estranged from her husband's side of the family now.  Patient works for [employer] in [employer] and has done so for 15 years.  She is happy at work.  She is part of a online widowed- survivors group.  She has good support of her family and friends.  Patient with history of depression was on Wellbutrin in the past for couple years and has not needed anything since.  She denies needing medicines now and denies needing a Social worker.  Her daughter does see a Social worker however.  She has been diagnosed in the past with high blood pressure but was never on medicines for it.  She had did dietary and lifestyle modification and brought it down on her own but this was several years ago.     history of elevated LDL-   at the time diet and lifestyle brought it down but has not been checked since   Family history diabetes-type I in her father.  Onset was around age 29s.  Father was a heavy smoker and died of lung cancer at age 32 back in 22.  Does have family history of breast cancer in her maternal grandmother who died in early 35s and paternal grandmother had ovarian cancer in her 21s.  Of note patient also sees Dr. Trenton Gammon of [facility] neurosurgery.  Within the past several months patient was seen by her GYN for saddle anesthesia as well as difficulties urinating.  They had come on together.  She was ruled out for urinary tract infection and told to follow-up with her PCP.  Upon presentation to her prior PCP they ordered an MRI of her L-spine which was done 12/15/2017 and referred her to neurosurgery.   She has another follow-up office visit this Thursday-in 3 days- with Dr. Trenton Gammon of neurosurgery who is following her for lower extremity numbness.    Wt Readings from Last 3 Encounters:  01/26/18 220 lb 8 oz (100 kg)  12/15/17 219 lb 4 oz (99.5 kg)    07/13/15 213 lb 1.9 oz (96.7 kg)   BP Readings from Last 3 Encounters:  01/26/18 (!) 147/92  12/15/17 140/90  07/13/15 (!) 148/96   Pulse Readings from Last 3 Encounters:  01/26/18 60  12/15/17 81  07/13/15 71   BMI Readings from Last 3 Encounters:  01/26/18 34.03 kg/m  12/15/17 33.83 kg/m  07/13/15 32.40 kg/m    Patient Care Team    Relationship Specialty Notifications Start End  Pleas Koch, NP PCP - General Internal Medicine  07/13/15   Obgyn, Erling Conte Attending Physician Gynecology  01/26/18    Comment: Patient sees Dr. Valentino Saxon at [facility] for her routine GYN care  Earnie Larsson, MD Consulting Physician  Neurosurgery  02-06-18    Comment: Sees patient for discogenic lumbar disease and radiculopathy    Patient Active Problem List   Diagnosis Date Noted  . History of hypertension-   did diet and lifestyle modification to better control in the past 02-06-18    Priority: High  . History of hyperlipidemia 02-06-18    Priority: High  . Obesity, Class I, BMI 30-34.9 06-Feb-2018    Priority: Medium  . History of vitamin D deficiency 02-06-18    Priority: Medium  . History of episode of depression- on wellbutrin in past due to stress of ex-husband 02-06-18    Priority: Medium  . Family history of diabetes mellitus in father- in 43's; [place] 02-06-2018    Priority: Low  . Family history of lung cancer-father died age 47 history of heavy smoking 02/06/2018    Priority: Low  . Widowed-  husband died Suicide 5 yrs ago Feb 06, 2018    Priority: Low  . Family history of breast cancer in female 02/06/18    Priority: Low  . Chronic left-sided low back pain with left-sided sciatica 2018-02-06    Priority: Low  . Lumbar discogenic pain syndrome 02/06/18    Priority: Low  . Elevated blood pressure reading 2018/02/06  . Facet arthropathy, lumbosacral 02/06/18  . Elevated blood pressure 07/13/2015  . Herpes zoster 07/13/2015     Past Medical History:   Diagnosis Date  . Endometrial polyp 2016  . Vaginal delivery 2005, 2008, 2010     Past Medical History:  Diagnosis Date  . Endometrial polyp 2016  . Vaginal delivery 2005, 2008, 2010     Past Surgical History:  Procedure Laterality Date  . DILATATION & CURETTAGE/HYSTEROSCOPY WITH MYOSURE N/A 10/14/2014   Procedure: DILATATION & CURETTAGE/HYSTEROSCOPY WITH MYOSURE;  Surgeon: Floyce Stakes. Pamala Hurry, MD;  Location: [facility];  Service: Gynecology;  Laterality: N/A;  . NO PAST SURGERIES       Family History  Problem Relation Age of Onset  . Cancer Father        metastatic lung (smoker)  . Diabetes Father   . Cancer Maternal Grandmother        breast, leukemia  . Cancer Paternal Grandmother        ovarian or cervical  . Hypertension Neg Hx   . CAD Neg Hx   . Stroke Neg Hx      Social History   Substance and Sexual Activity  Drug Use No     Social History   Substance and Sexual Activity  Alcohol Use Yes  . Alcohol/week: 0.0 oz   Comment: occasionally     Social History   Tobacco Use  Smoking Status Never Smoker  Smokeless Tobacco Never Used     Current Meds  Medication Sig  . Calcium Carb-Cholecalciferol (CALCIUM 500+D) 500-200 MG-UNIT TABS Take 1 tablet by mouth daily.    Allergies: Sulfonamide derivatives   Review of Systems  Constitutional: Negative for chills, diaphoresis, fever, malaise/fatigue and weight loss.  HENT: Negative for congestion, sore throat and tinnitus.   Eyes: Negative for blurred vision, double vision and photophobia.  Respiratory: Negative for cough and wheezing.   Cardiovascular: Negative for chest pain and palpitations.  Gastrointestinal: Negative for blood in stool, diarrhea, nausea and vomiting.  Genitourinary: Negative for dysuria, frequency and urgency.  Musculoskeletal: Negative for joint pain and myalgias.  Skin: Negative for itching and rash.  Neurological: Negative for dizziness, focal weakness, weakness and headaches.   Endo/Heme/Allergies: Negative for environmental allergies and  polydipsia. Does not bruise/bleed easily.  Psychiatric/Behavioral: Negative for depression and memory loss. The patient is not nervous/anxious and does not have insomnia.      Objective:   Blood pressure (!) 147/92, pulse 60, height 5' 7.5" (1.715 m), weight 220 lb 8 oz (100 kg), SpO2 98 %. Body mass index is 34.03 kg/m. General: Well Developed, well nourished, and in no acute distress.  Neuro: Alert and oriented x3, extra-ocular muscles intact, sensation grossly intact.  HEENT:[place]/AT, PERRLA, neck supple, No carotid bruits Skin: no gross rashes  Cardiac: Regular rate and rhythm Respiratory: Essentially clear to auscultation bilaterally. Not using accessory muscles, speaking in full sentences.  Abdominal: not grossly distended Musculoskeletal: Ambulates w/o diff, FROM * 4 ext.  Vasc: less 2 sec cap RF, warm and pink  Psych:  No HI/SI, judgement and insight good, Euthymic mood. Full Affect.   L-spine MRI done April 2019:  IMPRESSION: 1. No fracture or malalignment. Multilevel enhancing annular fissures. 2. Small L5-S1 disc protrusion resulting in LEFT S1 nerve impingement. 3. Small L2-3 and L4-5 disc protrusions encroach upon the traversing RIGHT L3 and exited LEFT L4 nerves respectively. 4. No canal stenosis.  Mild LEFT L4-5 neural foraminal narrowing.

## 2018-01-26 NOTE — Patient Instructions (Signed)
-Bring your blood pressure log next office visit in 4 weeks.  A couple days prior to that office visit appointment, please also schedule fasting blood work so that we can go over the results at that office visit.      Please realize, EXERCISE IS MEDICINE!  -  American Heart Association [facility]) guidelines for exercise : If you are in good health, without any medical conditions, you should engage in 150 minutes of moderate intensity aerobic activity per week.  This means you should be huffing and puffing throughout your workout.   Engaging in regular exercise will improve brain function and memory, as well as improve mood, boost immune system and help with weight management.  As well as the other, more well-known effects of exercise such as decreasing blood sugar levels, decreasing blood pressure,  and decreasing bad cholesterol levels/ increasing good cholesterol levels.     -  The AHA strongly endorses consumption of a diet that contains a variety of foods from all the food categories with an emphasis on fruits and vegetables; fat-free and low-fat dairy products; cereal and grain products; legumes and nuts; and fish, poultry, and/or extra lean meats.    Excessive food intake, especially of foods high in saturated and trans fats, sugar, and salt, should be avoided.    Adequate water intake of roughly 1/2 of your weight in pounds, should equal the ounces of water per day you should drink.  So for instance, if you're 200 pounds, that would be 100 ounces of water per day.         Mediterranean Diet  Why follow it? Research shows. . Those who follow the Mediterranean diet have a reduced risk of heart disease  . The diet is associated with a reduced incidence of Parkinson's and Alzheimer's diseases . People following the diet may have longer life expectancies and lower rates of chronic diseases  . The Dietary Guidelines for Americans recommends the Mediterranean diet as an eating plan to promote  health and prevent disease  What Is the Mediterranean Diet?  . Healthy eating plan based on typical foods and recipes of Mediterranean-style cooking . The diet is primarily a plant based diet; these foods should make up a majority of meals   Starches - Plant based foods should make up a majority of meals - They are an important sources of vitamins, minerals, energy, antioxidants, and fiber - Choose whole grains, foods high in fiber and minimally processed items  - Typical grain sources include wheat, oats, barley, corn, brown rice, bulgar, farro, millet, polenta, couscous  - Various types of beans include chickpeas, lentils, fava beans, black beans, white beans   Fruits  Veggies - Large quantities of antioxidant rich fruits & veggies; 6 or more servings  - Vegetables can be eaten raw or lightly drizzled with oil and cooked  - Vegetables common to the traditional Mediterranean Diet include: artichokes, arugula, beets, broccoli, brussel sprouts, cabbage, carrots, celery, collard greens, cucumbers, eggplant, kale, leeks, lemons, lettuce, mushrooms, okra, onions, peas, peppers, potatoes, pumpkin, radishes, rutabaga, shallots, spinach, sweet potatoes, turnips, zucchini - Fruits common to the Mediterranean Diet include: apples, apricots, avocados, cherries, clementines, dates, figs, grapefruits, grapes, melons, nectarines, oranges, peaches, pears, pomegranates, strawberries, tangerines  Fats - Replace butter and margarine with healthy oils, such as olive oil, canola oil, and tahini  - Limit nuts to no more than a handful a day  - Nuts include walnuts, almonds, pecans, pistachios, pine nuts  - Limit or  avoid candied, honey roasted or heavily salted nuts - Olives are central to the Mediterranean diet - can be eaten whole or used in a variety of dishes   Meats Protein - Limiting red meat: no more than a few times a month - When eating red meat: choose lean cuts and keep the portion to the size of deck of  cards - Eggs: approx. 0 to 4 times a week  - Fish and lean poultry: at least 2 a week  - Healthy protein sources include, chicken, [place], lean beef, lamb - Increase intake of seafood such as tuna, salmon, trout, mackerel, shrimp, scallops - Avoid or limit high fat processed meats such as sausage and bacon  Dairy - Include moderate amounts of low fat dairy products  - Focus on healthy dairy such as fat free yogurt, skim milk, low or reduced fat cheese - Limit dairy products higher in fat such as whole or 2% milk, cheese, ice cream  Alcohol - Moderate amounts of red wine is ok  - No more than 5 oz daily for women (all ages) and men older than age 47  - No more than 10 oz of wine daily for men younger than 30  Other - Limit sweets and other desserts  - Use herbs and spices instead of salt to flavor foods  - Herbs and spices common to the traditional Mediterranean Diet include: basil, bay leaves, chives, cloves, cumin, fennel, garlic, lavender, marjoram, mint, oregano, parsley, pepper, rosemary, sage, savory, sumac, tarragon, thyme   It's not just a diet, it's a lifestyle:  . The Mediterranean diet includes lifestyle factors typical of those in the region  . Foods, drinks and meals are best eaten with others and savored . Daily physical activity is important for overall good health . This could be strenuous exercise like running and aerobics . This could also be more leisurely activities such as walking, housework, yard-work, or taking the stairs . Moderation is the key; a balanced and healthy diet accommodates most foods and drinks . Consider portion sizes and frequency of consumption of certain foods   Meal Ideas & Options:  . Breakfast:  o Whole wheat toast or whole wheat English muffins with peanut butter & hard boiled egg o Steel cut oats topped with apples & cinnamon and skim milk  o Fresh fruit: banana, strawberries, melon, berries, peaches  o Smoothies: strawberries, bananas, greek  yogurt, peanut butter o Low fat greek yogurt with blueberries and granola  o Egg white omelet with spinach and mushrooms o Breakfast couscous: whole wheat couscous, apricots, skim milk, cranberries  . Sandwiches:  o Hummus and grilled vegetables (peppers, zucchini, squash) on whole wheat bread   o Grilled chicken on whole wheat pita with lettuce, tomatoes, cucumbers or tzatziki  o Tuna salad on whole wheat bread: tuna salad made with greek yogurt, olives, red peppers, capers, green onions o Garlic rosemary lamb pita: lamb sauted with garlic, rosemary, salt & pepper; add lettuce, cucumber, greek yogurt to pita - flavor with lemon juice and black pepper  . Seafood:  o Mediterranean grilled salmon, seasoned with garlic, basil, parsley, lemon juice and black pepper o Shrimp, lemon, and spinach whole-grain pasta salad made with low fat greek yogurt  o Seared scallops with lemon orzo  o Seared tuna steaks seasoned salt, pepper, coriander topped with tomato mixture of olives, tomatoes, olive oil, minced garlic, parsley, green onions and cappers  . Meats:  o Herbed greek chicken salad with kalamata  olives, cucumber, feta  o Red bell peppers stuffed with spinach, bulgur, lean ground beef (or lentils) & topped with feta   o Kebabs: skewers of chicken, tomatoes, onions, zucchini, squash  o [place] burgers: made with red onions, mint, dill, lemon juice, feta cheese topped with roasted red peppers . Vegetarian o Cucumber salad: cucumbers, artichoke hearts, celery, red onion, feta cheese, tossed in olive oil & lemon juice  o Hummus and whole grain pita points with a greek salad (lettuce, tomato, feta, olives, cucumbers, red onion) o Lentil soup with celery, carrots made with vegetable broth, garlic, salt and pepper  o Tabouli salad: parsley, bulgur, mint, scallions, cucumbers, tomato, radishes, lemon juice, olive oil, salt and pepper.

## 2018-01-29 DIAGNOSIS — Z6835 Body mass index (BMI) 35.0-35.9, adult: Secondary | ICD-10-CM | POA: Diagnosis not present

## 2018-01-29 DIAGNOSIS — M5126 Other intervertebral disc displacement, lumbar region: Secondary | ICD-10-CM | POA: Diagnosis not present

## 2018-01-29 DIAGNOSIS — R030 Elevated blood-pressure reading, without diagnosis of hypertension: Secondary | ICD-10-CM | POA: Diagnosis not present

## 2018-03-02 ENCOUNTER — Other Ambulatory Visit: Payer: BLUE CROSS/BLUE SHIELD

## 2018-03-02 DIAGNOSIS — Z8679 Personal history of other diseases of the circulatory system: Secondary | ICD-10-CM

## 2018-03-02 DIAGNOSIS — Z801 Family history of malignant neoplasm of trachea, bronchus and lung: Secondary | ICD-10-CM

## 2018-03-02 DIAGNOSIS — Z833 Family history of diabetes mellitus: Secondary | ICD-10-CM

## 2018-03-02 DIAGNOSIS — Z8639 Personal history of other endocrine, nutritional and metabolic disease: Secondary | ICD-10-CM

## 2018-03-02 DIAGNOSIS — R030 Elevated blood-pressure reading, without diagnosis of hypertension: Secondary | ICD-10-CM | POA: Diagnosis not present

## 2018-03-02 DIAGNOSIS — E669 Obesity, unspecified: Secondary | ICD-10-CM

## 2018-03-03 LAB — COMPREHENSIVE METABOLIC PANEL
ALT: 15 IU/L (ref 0–32)
AST: 13 IU/L (ref 0–40)
Albumin/Globulin Ratio: 2 (ref 1.2–2.2)
Albumin: 4 g/dL (ref 3.5–5.5)
Alkaline Phosphatase: 31 IU/L — ABNORMAL LOW (ref 39–117)
BUN/Creatinine Ratio: 18 (ref 9–23)
BUN: 13 mg/dL (ref 6–24)
Bilirubin Total: 0.3 mg/dL (ref 0.0–1.2)
CALCIUM: 8.8 mg/dL (ref 8.7–10.2)
CHLORIDE: 105 mmol/L (ref 96–106)
CO2: 23 mmol/L (ref 20–29)
Creatinine, Ser: 0.73 mg/dL (ref 0.57–1.00)
GFR, EST AFRICAN AMERICAN: 115 mL/min/{1.73_m2} (ref >59)
GFR, EST NON AFRICAN AMERICAN: 100 mL/min/{1.73_m2} (ref >59)
GLUCOSE: 96 mg/dL (ref 65–99)
Globulin, Total: 2 g/dL (ref 1.5–4.5)
Potassium: 4.5 mmol/L (ref 3.5–5.2)
Sodium: 143 mmol/L (ref 134–144)
TOTAL PROTEIN: 6 g/dL (ref 6.0–8.5)

## 2018-03-03 LAB — VITAMIN D 25 HYDROXY (VIT D DEFICIENCY, FRACTURES): Vit D, 25-Hydroxy: 54.1 ng/mL (ref 30.0–100.0)

## 2018-03-03 LAB — CBC WITH DIFFERENTIAL/PLATELET
Basophils Absolute: 0 10*3/uL (ref 0.0–0.2)
Basos: 0 %
EOS (ABSOLUTE): 0.1 10*3/uL (ref 0.0–0.4)
Eos: 2 %
Hematocrit: 36.7 % (ref 34.0–46.6)
Hemoglobin: 12.5 g/dL (ref 11.1–15.9)
Immature Grans (Abs): 0 10*3/uL (ref 0.0–0.1)
Immature Granulocytes: 0 %
LYMPHS ABS: 1.7 10*3/uL (ref 0.7–3.1)
Lymphs: 28 %
MCH: 29.4 pg (ref 26.6–33.0)
MCHC: 34.1 g/dL (ref 31.5–35.7)
MCV: 86 fL (ref 79–97)
MONOCYTES: 7 %
Monocytes Absolute: 0.5 10*3/uL (ref 0.1–0.9)
NEUTROS ABS: 3.8 10*3/uL (ref 1.4–7.0)
Neutrophils: 63 %
Platelets: 281 10*3/uL (ref 150–450)
RBC: 4.25 x10E6/uL (ref 3.77–5.28)
RDW: 14.1 % (ref 12.3–15.4)
WBC: 6.1 10*3/uL (ref 3.4–10.8)

## 2018-03-03 LAB — LIPID PANEL
CHOLESTEROL TOTAL: 155 mg/dL (ref 100–199)
Chol/HDL Ratio: 3.3 ratio (ref 0.0–4.4)
HDL: 47 mg/dL (ref >39)
LDL Calculated: 90 mg/dL (ref 0–99)
Triglycerides: 90 mg/dL (ref 0–149)
VLDL Cholesterol Cal: 18 mg/dL (ref 5–40)

## 2018-03-03 LAB — T4, FREE: FREE T4: 1.14 ng/dL (ref 0.82–1.77)

## 2018-03-03 LAB — TSH: TSH: 2.43 u[IU]/mL (ref 0.450–4.500)

## 2018-03-03 LAB — HEMOGLOBIN A1C
ESTIMATED AVERAGE GLUCOSE: 97 mg/dL
HEMOGLOBIN A1C: 5 % (ref 4.8–5.6)

## 2018-03-05 ENCOUNTER — Encounter: Payer: Self-pay | Admitting: Family Medicine

## 2018-03-05 ENCOUNTER — Ambulatory Visit (INDEPENDENT_AMBULATORY_CARE_PROVIDER_SITE_OTHER): Payer: BLUE CROSS/BLUE SHIELD | Admitting: Family Medicine

## 2018-03-05 VITALS — BP 155/95 | HR 57 | Ht 68.0 in | Wt 220.0 lb

## 2018-03-05 DIAGNOSIS — Z8659 Personal history of other mental and behavioral disorders: Secondary | ICD-10-CM | POA: Diagnosis not present

## 2018-03-05 DIAGNOSIS — I10 Essential (primary) hypertension: Secondary | ICD-10-CM

## 2018-03-05 DIAGNOSIS — Z634 Disappearance and death of family member: Secondary | ICD-10-CM

## 2018-03-05 DIAGNOSIS — E669 Obesity, unspecified: Secondary | ICD-10-CM

## 2018-03-05 DIAGNOSIS — Z6379 Other stressful life events affecting family and household: Secondary | ICD-10-CM

## 2018-03-05 MED ORDER — AMLODIPINE-VALSARTAN-HCTZ 5-160-12.5 MG PO TABS: 1.0000 | ORAL_TABLET | Freq: Every day | ORAL | 1 refills | Status: DC

## 2018-03-05 MED ORDER — AMLODIPINE-VALSARTAN-HCTZ 5-160-12.5 MG PO TABS: 1.0000 | ORAL_TABLET | Freq: Every day | ORAL | 0 refills | Status: DC

## 2018-03-05 NOTE — Progress Notes (Signed)
Assessment and plan:  1. Essential hypertension   2. History of episode of depression- on wellbutrin in past due to stress of ex-husband   3. Stress in life due to family/ household   4. Widowed-  husband died Suicide 5 yrs ago   11. Obesity, Class I, BMI 30-34.9     1. HTN -start amlodipine-valsartan-hctz to which pt agreed. Start with 0.5 tablet qd, if BP is still not at goal, increase this to 1 tablet qd.  -BP remains elevated above goal. Check BP at home and keep a log. Bring this into next OV.  -prudent diet/exercise discussed.  -BP goal: <130/90 -Fup 6 weeks BP after starting meds.  2. H/o episode of depression/stress in life/widowed -pt declines starting meds at this time.  -encouraged regular exercise, take your dog for a walk regularly. -recommend counselor.  -it is important to make time for yourself.   3. Obesity -recommend losing weight.    -Drink adequate amounts of water per day, equal to half of your wt in oz.    Education and routine counseling performed. Handouts provided.  No orders of the defined types were placed in this encounter.   Meds ordered this encounter  Medications  . amLODIPine-Valsartan-HCTZ 5-160-12.5 MG TABS    Sig: Take 1 tablet by mouth daily.    Dispense:  30 tablet    Refill:  0  . amLODIPine-Valsartan-HCTZ 5-160-12.5 MG TABS    Sig: Take 1 tablet by mouth daily.    Dispense:  90 tablet    Refill:  1     Return for started Bp med- f/up 6 wks, obtain bmp&Ca at that time.   Anticipatory guidance and routine counseling done re: condition, txmnt options and need for follow up. All questions of patient's were answered.   Gross side effects, risk and benefits, and alternatives of medications discussed with patient.  Patient is aware that all medications have potential side effects and we are unable to predict every sideeffect or drug-drug interaction that may occur.   Expresses verbal understanding and consents to current therapy plan and treatment regiment.  Please see AVS handed out to patient at the end of our visit for additional patient instructions/ counseling done pertaining to today's office visit.  Note: This document was prepared using Dragon voice recognition software and may include unintentional dictation errors.  This document serves as a record of services personally performed by Mellody Dance, DO. It was created on her behalf by Mayer Masker, a trained medical scribe. The creation of this record is based on the scribe's personal observations and the provider's statements to them.   I have reviewed the above medical documentation for accuracy and completeness and I concur.  Mellody Dance 03/08/18 11:41 PM  ----------------------------------------------------------------------------------------------------------------------  Subjective:   CC:   Sheri Cole is a 47 y.o. female who presents to [place] at [facility] today for review and discussion of recent bloodwork that was done and FUP for BP recheck.  1. All recent blood work that we ordered was reviewed with patient today.  Patient was counseled on all abnormalities and we discussed dietary and lifestyle changes that could help those values (also medications when appropriate).  Extensive health counseling performed and all patient's concerns/ questions were addressed.   Mood Depression screen [facility] 2/9 03/05/2018 01/26/2018  Decreased Interest 0 1  Down, Depressed, Hopeless 1 1  PHQ - 2 Score 1 2  Altered sleeping 0  0  Tired, decreased energy 1 2  Change in appetite 0 1  Feeling bad or failure about yourself  0 1  Trouble concentrating 0 0  Moving slowly or fidgety/restless 0 0  Suicidal thoughts 1 0  PHQ-9 Score 3 6  Difficult doing work/chores - Not difficult at all   She states she was not able to unwind or truly enjoy her vacation because of her  children not being appreciative of this. She has been on Wellbutrin before, it was "okay" for her. She prefers to hold off on starting meds. She does not have a counselor, but has a widower group on Facebook that she talks to.   She denies suicidal ideations and does not have a plan.  HTN HPI:  -  Her blood pressure has not been controlled at home.  Pt is checking it at home.  -she has not been exercising much at all.   She has a FMHx in her sister of heart problems and HTN with PSHx of valve replacement.  154/91 157/108 146/104, 152/92, 162/106, 150/90  Some 170s-180s/100s, mostly 150s /high 90s.  - Patient reports good compliance with blood pressure medications  - Denies medication S-E   - Smoking Status noted   - She denies new onset of: chest pain, exercise intolerance, shortness of breath, dizziness, visual changes, headache, lower extremity swelling or claudication.   Last 3 blood pressure readings in our office are as follows: BP Readings from Last 3 Encounters:  03/05/18 (!) 155/95  01/26/18 (!) 147/92  12/15/17 140/90    Filed Weights   03/05/18 1011  Weight: 220 lb (99.8 kg)     Wt Readings from Last 3 Encounters:  03/05/18 220 lb (99.8 kg)  01/26/18 220 lb 8 oz (100 kg)  12/15/17 219 lb 4 oz (99.5 kg)   BP Readings from Last 3 Encounters:  03/05/18 (!) 155/95  01/26/18 (!) 147/92  12/15/17 140/90   Pulse Readings from Last 3 Encounters:  03/05/18 (!) 57  01/26/18 60  12/15/17 81   BMI Readings from Last 3 Encounters:  03/05/18 33.45 kg/m  01/26/18 34.03 kg/m  12/15/17 33.83 kg/m     Patient Care Team    Relationship Specialty Notifications Start End  Mellody Dance, DO PCP - General Family Medicine  01/26/18   Obgyn, Erling Conte Attending Physician Gynecology  01/26/18    Comment: Patient sees Dr. Valentino Saxon at [place] for her routine GYN care  Earnie Larsson, MD Consulting Physician Neurosurgery  01/26/18    Comment: Sees patient for  discogenic lumbar disease and radiculopathy  Lavonna Monarch, MD Consulting Physician Dermatology  01/26/18   Aloha Gell, MD Consulting Physician Obstetrics and Gynecology  01/26/18     Full medical history updated and reviewed in the office today  Patient Active Problem List   Diagnosis Date Noted  . Essential hypertension 03/05/2018    Priority: High  . History of hypertension-   did diet and lifestyle modification to better control in the past 01/26/2018    Priority: High  . History of hyperlipidemia 01/26/2018    Priority: High  . Obesity, Class I, BMI 30-34.9 01/26/2018    Priority: Medium  . History of vitamin D deficiency 01/26/2018    Priority: Medium  . History of episode of depression- on wellbutrin in past due to stress of ex-husband 01/26/2018    Priority: Medium  . Family history of diabetes mellitus in father- in 75's; [place] 01/26/2018    Priority: Low  .  Family history of lung cancer-father died age 69 history of heavy smoking 02/18/18    Priority: Low  . Widowed-  husband died Suicide 5 yrs ago 18-Feb-2018    Priority: Low  . Family history of breast cancer in female 18-Feb-2018    Priority: Low  . Chronic left-sided low back pain with left-sided sciatica 02/18/2018    Priority: Low  . Lumbar discogenic pain syndrome 02-18-2018    Priority: Low  . Stress in life due to family/ household 03/05/2018  . Elevated blood pressure reading Feb 18, 2018  . Facet arthropathy, lumbosacral Feb 18, 2018  . Elevated blood pressure 07/13/2015  . Herpes zoster 07/13/2015    Past Medical History:  Diagnosis Date  . Endometrial polyp 2016  . Vaginal delivery 2005, 2008, 2010    Past Surgical History:  Procedure Laterality Date  . DILATATION & CURETTAGE/HYSTEROSCOPY WITH MYOSURE N/A 10/14/2014   Procedure: DILATATION & CURETTAGE/HYSTEROSCOPY WITH MYOSURE;  Surgeon: Floyce Stakes. Pamala Hurry, MD;  Location: [facility];  Service: Gynecology;  Laterality: N/A;  . NO PAST SURGERIES       Social History   Tobacco Use  . Smoking status: Never Smoker  . Smokeless tobacco: Never Used  Substance Use Topics  . Alcohol use: Yes    Alcohol/week: 0.0 oz    Comment: occasionally    Family Hx: Family History  Problem Relation Age of Onset  . Cancer Father        metastatic lung (smoker)  . Diabetes Father   . Cancer Maternal Grandmother        breast, leukemia  . Cancer Paternal Grandmother        ovarian or cervical  . Hypertension Neg Hx   . CAD Neg Hx   . Stroke Neg Hx      Medications: Current Outpatient Medications  Medication Sig Dispense Refill  . Calcium Carb-Cholecalciferol (CALCIUM 500+D) 500-200 MG-UNIT TABS Take 1 tablet by mouth daily.    . vitamin B-12 (CYANOCOBALAMIN) 1000 MCG tablet Take 1,000 mcg by mouth daily.    Marland Kitchen amLODIPine-Valsartan-HCTZ 5-160-12.5 MG TABS Take 1 tablet by mouth daily. 30 tablet 0  . amLODIPine-Valsartan-HCTZ 5-160-12.5 MG TABS Take 1 tablet by mouth daily. 90 tablet 1   No current facility-administered medications for this visit.     Allergies:  Allergies  Allergen Reactions  . Sulfonamide Derivatives Hives     Review of Systems: General:   No F/C, wt loss Pulm:   No DIB, SOB, pleuritic chest pain Card:  No CP, palpitations Abd:  No n/v/d or pain Ext:  No inc edema from baseline  Objective:  Blood pressure (!) 155/95, pulse (!) 57, height 5\' 8"  (1.727 m), weight 220 lb (99.8 kg), SpO2 98 %. Body mass index is 33.45 kg/m. Gen:   Well NAD, A and O *3 HEENT:    [employer]/AT, EOMI,  MMM Lungs:   Normal work of breathing. CTA B/L, no Wh, rhonchi Heart:   RRR, S1, S2 WNL's, no MRG Abd:   No gross distention Exts:    warm, pink,  Brisk capillary refill, warm and well perfused.  Psych:    No HI/SI, judgement and insight good, Euthymic mood. Full Affect.   Recent Results (from the past 2160 hour(s))  T4, free     Status: None   Collection Time: 03/02/18  8:55 AM  Result Value Ref Range   Free T4 1.14 0.82 - 1.77  ng/dL  CBC with Differential/Platelet     Status: None   Collection  Time: 03/02/18  8:55 AM  Result Value Ref Range   WBC 6.1 3.4 - 10.8 x10E3/uL   RBC 4.25 3.77 - 5.28 x10E6/uL   Hemoglobin 12.5 11.1 - 15.9 g/dL   Hematocrit 36.7 34.0 - 46.6 %   MCV 86 79 - 97 fL   MCH 29.4 26.6 - 33.0 pg   MCHC 34.1 31.5 - 35.7 g/dL   RDW 14.1 12.3 - 15.4 %   Platelets 281 150 - 450 x10E3/uL   Neutrophils 63 Not Estab. %   Lymphs 28 Not Estab. %   Monocytes 7 Not Estab. %   Eos 2 Not Estab. %   Basos 0 Not Estab. %   Neutrophils Absolute 3.8 1.4 - 7.0 x10E3/uL   Lymphocytes Absolute 1.7 0.7 - 3.1 x10E3/uL   Monocytes Absolute 0.5 0.1 - 0.9 x10E3/uL   EOS (ABSOLUTE) 0.1 0.0 - 0.4 x10E3/uL   Basophils Absolute 0.0 0.0 - 0.2 x10E3/uL   Immature Granulocytes 0 Not Estab. %   Immature Grans (Abs) 0.0 0.0 - 0.1 x10E3/uL  VITAMIN D 25 Hydroxy (Vit-D Deficiency, Fractures)     Status: None   Collection Time: 03/02/18  8:55 AM  Result Value Ref Range   Vit D, 25-Hydroxy 54.1 30.0 - 100.0 ng/mL    Comment: Vitamin D deficiency has been defined by the Institute of Medicine and an Endocrine Society practice guideline as a level of serum 25-OH vitamin D less than 20 ng/mL (1,2). The Endocrine Society went on to further define vitamin D insufficiency as a level between 21 and 29 ng/mL (2). 1. IOM (Institute of Medicine). 2010. Dietary reference    intakes for calcium and D. [place]: The    [employer]. 2. Holick MF, Binkley [place], Bischoff-Ferrari HA, et al.    Evaluation, treatment, and prevention of vitamin D    deficiency: an Endocrine Society clinical practice    guideline. JCEM. 2011 Jul; 96(7):1911-30.   TSH     Status: None   Collection Time: 03/02/18  8:55 AM  Result Value Ref Range   TSH 2.430 0.450 - 4.500 uIU/mL  Lipid panel     Status: None   Collection Time: 03/02/18  8:55 AM  Result Value Ref Range   Cholesterol, Total 155 100 - 199 mg/dL   Triglycerides 90 0 - 149  mg/dL   HDL 47 >39 mg/dL   VLDL Cholesterol Cal 18 5 - 40 mg/dL   LDL Calculated 90 0 - 99 mg/dL   Chol/HDL Ratio 3.3 0.0 - 4.4 ratio    Comment:                                   T. Chol/HDL Ratio                                             Men  Women                               1/2 Avg.Risk  3.4    3.3                                   Avg.Risk  5.0  4.4                                2X Avg.Risk  9.6    7.1                                3X Avg.Risk 23.4   11.0   Hemoglobin A1c     Status: None   Collection Time: 03/02/18  8:55 AM  Result Value Ref Range   Hgb A1c MFr Bld 5.0 4.8 - 5.6 %    Comment:          Prediabetes: 5.7 - 6.4          Diabetes: >6.4          Glycemic control for adults with diabetes: <7.0    Est. average glucose Bld gHb Est-mCnc 97 mg/dL  Comprehensive metabolic panel     Status: Abnormal   Collection Time: 03/02/18  8:55 AM  Result Value Ref Range   Glucose 96 65 - 99 mg/dL   BUN 13 6 - 24 mg/dL   Creatinine, Ser 0.73 0.57 - 1.00 mg/dL   GFR calc non Af Amer 100 >59 mL/min/1.73   GFR calc Af Amer 115 >59 mL/min/1.73   BUN/Creatinine Ratio 18 9 - 23   Sodium 143 134 - 144 mmol/L   Potassium 4.5 3.5 - 5.2 mmol/L   Chloride 105 96 - 106 mmol/L   CO2 23 20 - 29 mmol/L   Calcium 8.8 8.7 - 10.2 mg/dL   Total Protein 6.0 6.0 - 8.5 g/dL   Albumin 4.0 3.5 - 5.5 g/dL   Globulin, Total 2.0 1.5 - 4.5 g/dL   Albumin/Globulin Ratio 2.0 1.2 - 2.2   Bilirubin Total 0.3 0.0 - 1.2 mg/dL   Alkaline Phosphatase 31 (L) 39 - 117 IU/L   AST 13 0 - 40 IU/L   ALT 15 0 - 32 IU/L

## 2018-03-05 NOTE — Patient Instructions (Addendum)
-Please start with 1/2 tablet of your blood pressure medicines daily and monitor your blood pressure at home.  Please write it down and keep a log.  After 1 week if your blood pressures not at goal of around 130s\80s or less, please increase to 1 full tablet daily.  Please consider a life coach\counselor that you can see on a regular basis to help with stressors of life  -Also we need to transition your brain into thinking more positively.  These tasks below are some things I want you to do every day 1)  write 3 new things that you are grateful for every day for 21 days  2)  exercise daily- walk for 15 minutes twice a day every day 3)  you are going to journal every day about one positive experience that you had 4)  meditate every day.  You can go on YouTube and look for 15-minute relaxation meditation or what ever.  But we need to make sure that you are in the moment and relaxing and deep breathing every day 5)  Write 1 positive email every day to praise someone in your life     - If you have insomnia or difficulty sleeping, this information is for you:  - Avoid caffeinated beverages after lunch,  no alcoholic beverages,  no eating within 2-3 hours of lying down,  avoid exposure to blue light before bed,  avoid daytime naps, and  needs to maintain a regular sleep schedule- go to sleep and wake up around the same time every night.   - Resolve concerns or worries before entering bedroom:  Discussed relaxation techniques with patient and to keep a journal to write down fears\ worries.  I suggested seeing a counselor for CBT.   - Recommend patient meditate or do deep breathing exercises to help relax.   Incorporate the use of white noise machines or listen to "sleep meditation music", or recordings of guided meditations for sleep from YouTube which are free, such as  "guided meditation for detachment from over thinking"  by Mayford Knife.      Behavioral Health/ Counseling  Referrals    Anderson Malta, personal counselor in [place], specializing in marriage counseling    Dr. Tomi Bamberger, PHD Dr. Tomi Bamberger, PHD is a counselor in [place], [place].  [address] [place], [place] [address] [place] [phone]   Kristie Cowman, [place]  [phone] [email or web address] ( she does [place] and faith-based coaching and counseling )    [employer] ( faith-based counseling ) Address: [place]. [place], [place] [address] [phone] for appointments [phone] Fax Hours: Monday - Thursday 8:00am-6:00pm Closed for lunch 12-1Thursday only Friday: Closed all day   Steffanie Rainwater: [phone] or Meg Martinique- [phone] -counselors in [place] who are faith based    [facility] Nunzio Cobbs, Fidelis, ACSW, M.ED.  -Nunzio Cobbs is a licensed clinical social worker in practice over 17 years and with Dr. Chucky May for the last 10 years.  -She sees adults, adolescents, children & families and couples. -Services are provided for mood and anxiety disorders, marital issues, family or parent/child problems, parenting, co-dependency, gender issues, trauma, grief, and stages of life issues. She also provides critical incident stress debriefing.  -Pamala Hurry accepts many employee assistance programs (EAP), [employer], Pharmacist, hospital.  PHONE  [phone]  FAX [phone]   [email or web address] [employer]- gen counseling;  PHD   Wilber Oliphant, MSW [address] [place]   [facility] Apolonio Schneiders, PhD [address], [place] [phone]   [facility]- children [address] [place], [place] [phone]   Lanelle Bal Professional  Counselor Counseling and [employer] abuse [facility] Manager [address] [place] [phone] [place] [address] [place] Delmer Islam, PhD **   -adult, adolescent, and child Oneida Arenas, PhD Leitha Bleak, LCSW Jillene Bucks, PhD-child, adolescent and adults   [facility] [address], [place] [phone] Merilyn Baba, MS-child, adolescent and adults Lennart Pall, PhD-adolescent and adults   [facility]-grief, terminal illness [place], [place] [address], [facility] [address], [place], [place]   [facility] [address], [place], [place] [phone]   [facility] of the [facility] [address] [place] [phone]   [facility] [address], [place]   [facility] [address] [place] [address], [place]

## 2018-03-28 ENCOUNTER — Other Ambulatory Visit: Payer: Self-pay | Admitting: Family Medicine

## 2018-04-16 ENCOUNTER — Ambulatory Visit (INDEPENDENT_AMBULATORY_CARE_PROVIDER_SITE_OTHER): Payer: BLUE CROSS/BLUE SHIELD | Admitting: Family Medicine

## 2018-04-16 ENCOUNTER — Encounter: Payer: Self-pay | Admitting: Family Medicine

## 2018-04-16 VITALS — BP 122/83 | HR 59 | Ht 68.0 in | Wt 222.7 lb

## 2018-04-16 DIAGNOSIS — I10 Essential (primary) hypertension: Secondary | ICD-10-CM | POA: Diagnosis not present

## 2018-04-16 DIAGNOSIS — E669 Obesity, unspecified: Secondary | ICD-10-CM

## 2018-04-16 DIAGNOSIS — Z6379 Other stressful life events affecting family and household: Secondary | ICD-10-CM | POA: Diagnosis not present

## 2018-04-16 NOTE — Patient Instructions (Signed)
Cont to monitor BP at home. Great job!!  Please realize, EXERCISE IS MEDICINE!  -  American Heart Association ( AHA) guidelines for exercise : If you are in good health, without any medical conditions, you should engage in 150 minutes of moderate intensity aerobic activity per week.  This means you should be huffing and puffing throughout your workout.   Engaging in regular exercise will improve brain function and memory, as well as improve mood, boost immune system and help with weight management.  As well as the other, more well-known effects of exercise such as decreasing blood sugar levels, decreasing blood pressure,  and decreasing bad cholesterol levels/ increasing good cholesterol levels.     -  The AHA strongly endorses consumption of a diet that contains a variety of foods from all the food categories with an emphasis on fruits and vegetables; fat-free and low-fat dairy products; cereal and grain products; legumes and nuts; and fish, poultry, and/or extra lean meats.    Excessive food intake, especially of foods high in saturated and trans fats, sugar, and salt, should be avoided.    Adequate water intake of roughly 1/2 of your weight in pounds, should equal the ounces of water per day you should drink.  So for instance, if you're 200 pounds, that would be 100 ounces of water per day.         Mediterranean Diet  Why follow it? Research shows. . Those who follow the Mediterranean diet have a reduced risk of heart disease  . The diet is associated with a reduced incidence of Parkinson's and Alzheimer's diseases . People following the diet may have longer life expectancies and lower rates of chronic diseases  . The Dietary Guidelines for Americans recommends the Mediterranean diet as an eating plan to promote health and prevent disease  What Is the Mediterranean Diet?  . Healthy eating plan based on typical foods and recipes of Mediterranean-style cooking . The diet is primarily a plant  based diet; these foods should make up a majority of meals   Starches - Plant based foods should make up a majority of meals - They are an important sources of vitamins, minerals, energy, antioxidants, and fiber - Choose whole grains, foods high in fiber and minimally processed items  - Typical grain sources include wheat, oats, barley, corn, brown rice, bulgar, farro, millet, polenta, couscous  - Various types of beans include chickpeas, lentils, fava beans, black beans, white beans   Fruits  Veggies - Large quantities of antioxidant rich fruits & veggies; 6 or more servings  - Vegetables can be eaten raw or lightly drizzled with oil and cooked  - Vegetables common to the traditional Mediterranean Diet include: artichokes, arugula, beets, broccoli, brussel sprouts, cabbage, carrots, celery, collard greens, cucumbers, eggplant, kale, leeks, lemons, lettuce, mushrooms, okra, onions, peas, peppers, potatoes, pumpkin, radishes, rutabaga, shallots, spinach, sweet potatoes, turnips, zucchini - Fruits common to the Mediterranean Diet include: apples, apricots, avocados, cherries, clementines, dates, figs, grapefruits, grapes, melons, nectarines, oranges, peaches, pears, pomegranates, strawberries, tangerines  Fats - Replace butter and margarine with healthy oils, such as olive oil, canola oil, and tahini  - Limit nuts to no more than a handful a day  - Nuts include walnuts, almonds, pecans, pistachios, pine nuts  - Limit or avoid candied, honey roasted or heavily salted nuts - Olives are central to the Mediterranean diet - can be eaten whole or used in a variety of dishes   Meats Protein - Limiting red  meat: no more than a few times a month - When eating red meat: choose lean cuts and keep the portion to the size of deck of cards - Eggs: approx. 0 to 4 times a week  - Fish and lean poultry: at least 2 a week  - Healthy protein sources include, chicken, [place], lean beef, lamb - Increase intake of  seafood such as tuna, salmon, trout, mackerel, shrimp, scallops - Avoid or limit high fat processed meats such as sausage and bacon  Dairy - Include moderate amounts of low fat dairy products  - Focus on healthy dairy such as fat free yogurt, skim milk, low or reduced fat cheese - Limit dairy products higher in fat such as whole or 2% milk, cheese, ice cream  Alcohol - Moderate amounts of red wine is ok  - No more than 5 oz daily for women (all ages) and men older than age 35  - No more than 10 oz of wine daily for men younger than 75  Other - Limit sweets and other desserts  - Use herbs and spices instead of salt to flavor foods  - Herbs and spices common to the traditional Mediterranean Diet include: basil, bay leaves, chives, cloves, cumin, fennel, garlic, lavender, marjoram, mint, oregano, parsley, pepper, rosemary, sage, savory, sumac, tarragon, thyme   It's not just a diet, it's a lifestyle:  . The Mediterranean diet includes lifestyle factors typical of those in the region  . Foods, drinks and meals are best eaten with others and savored . Daily physical activity is important for overall good health . This could be strenuous exercise like running and aerobics . This could also be more leisurely activities such as walking, housework, yard-work, or taking the stairs . Moderation is the key; a balanced and healthy diet accommodates most foods and drinks . Consider portion sizes and frequency of consumption of certain foods   Meal Ideas & Options:  . Breakfast:  o Whole wheat toast or whole wheat English muffins with peanut butter & hard boiled egg o Steel cut oats topped with apples & cinnamon and skim milk  o Fresh fruit: banana, strawberries, melon, berries, peaches  o Smoothies: strawberries, bananas, greek yogurt, peanut butter o Low fat greek yogurt with blueberries and granola  o Egg white omelet with spinach and mushrooms o Breakfast couscous: whole wheat couscous, apricots,  skim milk, cranberries  . Sandwiches:  o Hummus and grilled vegetables (peppers, zucchini, squash) on whole wheat bread   o Grilled chicken on whole wheat pita with lettuce, tomatoes, cucumbers or tzatziki  o Tuna salad on whole wheat bread: tuna salad made with greek yogurt, olives, red peppers, capers, green onions o Garlic rosemary lamb pita: lamb sauted with garlic, rosemary, salt & pepper; add lettuce, cucumber, greek yogurt to pita - flavor with lemon juice and black pepper  . Seafood:  o Mediterranean grilled salmon, seasoned with garlic, basil, parsley, lemon juice and black pepper o Shrimp, lemon, and spinach whole-grain pasta salad made with low fat greek yogurt  o Seared scallops with lemon orzo  o Seared tuna steaks seasoned salt, pepper, coriander topped with tomato mixture of olives, tomatoes, olive oil, minced garlic, parsley, green onions and cappers  . Meats:  o Herbed greek chicken salad with kalamata olives, cucumber, feta  o Red bell peppers stuffed with spinach, bulgur, lean ground beef (or lentils) & topped with feta   o Kebabs: skewers of chicken, tomatoes, onions, zucchini, squash  o [place]  burgers: made with red onions, mint, dill, lemon juice, feta cheese topped with roasted red peppers . Vegetarian o Cucumber salad: cucumbers, artichoke hearts, celery, red onion, feta cheese, tossed in olive oil & lemon juice  o Hummus and whole grain pita points with a greek salad (lettuce, tomato, feta, olives, cucumbers, red onion) o Lentil soup with celery, carrots made with vegetable broth, garlic, salt and pepper  o Tabouli salad: parsley, bulgur, mint, scallions, cucumbers, tomato, radishes, lemon juice, olive oil, salt and pepper.   Behavior Modification Ideas for Weight Management  Weight management involves adopting a healthy lifestyle that includes a knowledge of nutrition and exercise, a positive attitude and the right kind of motivation. Internal motives such as  better health, increased energy, self-esteem and personal control increase your chances of lifelong weight management success.  Remember to have realistic goals and think long-term success. Believe in yourself and you can do it. The following information will give you ideas to help you meet your goals.  Control Your Home Environment  Eat only while sitting down at the kitchen or dining room table. Do not eat while watching television, reading, cooking, talking on the phone, standing at the refrigerator or working on the computer. Keep tempting foods out of the house - don't buy them. Keep tempting foods out of sight. Have low-calorie foods ready to eat. Unless you are preparing a meal, stay out of the kitchen. Have healthy snacks at your disposal, such as small pieces of fruit, vegetables, canned fruit, pretzels, low-fat string cheese and nonfat cottage cheese.  Control Your Work Environment  Do not eat at [employer] or keep tempting snacks at your desk. If you get hungry between meals, plan healthy snacks and bring them with you to work. During your breaks, go for a walk instead of eating. If you work around food, plan in advance the one item you will eat at mealtime. Make it inconvenient to nibble on food by chewing gum, sugarless candy or drinking water or another low-calorie beverage. Do not work through meals. Skipping meals slows down metabolism and may result in overeating at the next meal. If food is available for special occasions, either pick the healthiest item, nibble on low-fat snacks brought from home, don't have anything offered, choose one option and have a small amount, or have only a beverage.  Control Your Mealtime Environment  Serve your plate of food at the stove or kitchen counter. Do not put the serving dishes on the table. If you do put dishes on the table, remove them immediately when finished eating. Fill half of your plate with vegetables, a quarter with lean  protein and a quarter with starch. Use smaller plates, bowls and glasses. A smaller portion will look large when it is in a little dish. Politely refuse second helpings. When fixing your plate, limit portions of food to one scoop/serving or less.   Daily Food Management  Replace eating with another activity that you will not associate with food. Wait 20 minutes before eating something you are craving. Drink a large glass of water or diet soda before eating. Always have a big glass or bottle of water to drink throughout the day. Avoid high-calorie add-ons such as cream with your coffee, butter, mayonnaise and salad dressings.  Shopping: Do not shop when hungry or tired. Shop from a list and avoid buying anything that is not on your list. If you must have tempting foods, buy individual-sized packages and try to find a  lower-calorie alternative. Don't taste test in the store. Read food labels. Compare products to help you make the healthiest choices.  Preparation: Chew a piece of gum while cooking meals. Use a quarter teaspoon if you taste test your food. Try to only fix what you are going to eat, leaving yourself no chance for seconds. If you have prepared more food than you need, portion it into individual containers and freeze or refrigerate immediately. Don't snack while cooking meals.  Eating: Eat slowly. Remember it takes about 20 minutes for your stomach to send a message to your brain that it is full. Don't let fake hunger make you think you need more. The ideal way to eat is to take a bite, put your utensil down, take a sip of water, cut your next bite, take a bit, put your utensil down and so on. Do not cut your food all at one time. Cut only as needed. Take small bites and chew your food well. Stop eating for a minute or two at least once during a meal or snack. Take breaks to reflect and have conversation.  Cleanup and Leftovers: Label leftovers for a specific meal or  snack. Freeze or refrigerate individual portions of leftovers. Do not clean up if you are still hungry.  Eating Out and Social Eating  Do not arrive hungry. Eat something light before the meal. Try to fill up on low-calorie foods, such as vegetables and fruit, and eat smaller portions of the high-calorie foods. Eat foods that you like, but choose small portions. If you want seconds, wait at least 20 minutes after you have eaten to see if you are actually hungry or if your eyes are bigger than your stomach. Limit alcoholic beverages. Try a soda water with a twist of lime. Do not skip other meals in the day to save room for the special event.  At Restaurants: Order  la carte rather than buffet style. Order some vegetables or a salad for an appetizer instead of eating bread. If you order a high-calorie dish, share it with someone. Try an after-dinner mint with your coffee. If you do have dessert, share it with two or more people. Don't overeat because you do not want to waste food. Ask for a doggie bag to take extra food home. Tell the server to put half of your entree in a to go bag before the meal is served to you. Ask for salad dressing, gravy or high-fat sauces on the side. Dip the tip of your fork in the dressing before each bite. If bread is served, ask for only one piece. Try it plain without butter or oil. At [employer] where oil and vinegar is served with bread, use only a small amount of oil and a lot of vinegar for dipping.  At a [facility]: Offer to bring a dish, appetizer or dessert that is low in calories. Serve yourself small portions or tell the host that you only want a small amount. Stand or sit away from the snack table. Stay away from the kitchen or stay busy if you are near the food. Limit your alcohol intake.  At [employer] and Cafeterias: Cover most of your plate with lettuce and/or vegetables. Use a salad plate instead of a dinner plate. After eating,  clear away your dishes before having coffee or tea.  Entertaining at Home: Explore low-fat, low-cholesterol cookbooks. Use single-serving foods like chicken breasts or hamburger patties. Prepare low-calorie appetizers and desserts.   Holidays: Keep tempting  foods out of sight. Decorate the house without using food. Have low-calorie beverages and foods on hand for guests. Allow yourself one planned treat a day. Don't skip meals to save up for the holiday feast. Eat regular, planned meals.   Exercise Well  Make exercise a priority and a planned activity in the day. If possible, walk the entire or part of the distance to work. Get an exercise buddy. Go for a walk with a colleague during one of your breaks, go to the gym, run or take a walk with a friend, walk in the mall with a shopping companion. Park at the end of the parking lot and walk to the store or office entrance. Always take the stairs all of the way or at least part of the way to your floor. If you have a desk job, walk around the office frequently. Do leg lifts while sitting at your desk. Do something outside on the weekends like going for a hike or a bike ride.   Have a Healthy Attitude  Make health your weight management priority. Be realistic. Have a goal to achieve a healthier you, not necessarily the lowest weight or ideal weight based on calculations or tables. Focus on a healthy eating style, not on dieting. Dieting usually lasts for a short amount of time and rarely produces long-term success. Think long term. You are developing new healthy behaviors to follow next month, in a year and in a decade.    This information is for educational purposes only and is not intended to replace the advice of your doctor or health care provider. We encourage you to discuss with your doctor any questions or concerns you may have.        Guidelines for Losing Weight   We want weight loss that will last so you should  lose 1-2 pounds a week.  THAT IS IT! Please pick THREE things a month to change. Once it is a habit check off the item. Then pick another three items off the list to become habits.  If you are already doing a habit on the list GREAT!  Cross that item off!  Don't drink your calories. Ie, alcohol, soda, fruit juice, and sweet tea.   Drink more water. Drink a glass when you feel hungry or before each meal.   Eat breakfast - Complex carb and protein (likeDannon light and fit yogurt, oatmeal, fruit, eggs, [place] bacon).  Measure your cereal.  Eat no more than one cup a day. (ie Kashi)  Eat an apple a day.  Add a vegetable a day.  Try a new vegetable a month.  Use Pam! Stop using oil or butter to cook.  Don't finish your plate or use smaller plates.  Share your dessert.  Eat sugar free Jello for dessert or frozen grapes.  Don't eat 2-3 hours before bed.  Switch to whole wheat bread, pasta, and brown rice.  Make healthier choices when you eat out. No fries!  Pick baked chicken, NOT fried.  Don't forget to SLOW DOWN when you eat. It is not going anywhere.   Take the stairs.  Park far away in the parking lot  Lift soup cans (or weights) for 10 minutes while watching TV.  Walk at work for 10 minutes during break.  Walk outside 1 time a week with your friend, kids, dog, or significant other.  Start a walking group at [place].  Walk the mall as much as you can tolerate.   Keep  a food diary.  Weigh yourself daily.  Walk for 15 minutes 3 days per week.  Cook at home more often and eat out less. If life happens and you go back to old habits, it is okay.  Just start over. You can do it!  If you experience chest pain, get short of breath, or tired during the exercise, please stop immediately and inform your doctor.    Before you even begin to attack a weight-loss plan, it pays to remember this: You are not fat. You have fat. Losing weight isn't about blame or shame; it's  simply another achievement to accomplish. Dieting is like any other skill-you have to buckle down and work at it. As long as you act in a smart, reasonable way, you'll ultimately get where you want to be. Here are some weight loss pearls for you.   1. It's Not a Diet. It's a Lifestyle Thinking of a diet as something you're on and suffering through only for the short term doesn't work. To shed weight and keep it off, you need to make permanent changes to the way you eat. It's OK to indulge occasionally, of course, but if you cut calories temporarily and then revert to your old way of eating, you'll gain back the weight quicker than you can say yo-yo. Use it to lose it. Research shows that one of the best predictors of long-term weight loss is how many pounds you drop in the first month. For that reason, nutritionists often suggest being stricter for the first two weeks of your new eating strategy to build momentum. Cut out added sugar and alcohol and avoid unrefined carbs. After that, figure out how you can reincorporate them in a way that's healthy and maintainable.  2. There's a Right Way to Exercise Working out burns calories and fat and boosts your metabolism by building muscle. But those trying to lose weight are notorious for overestimating the number of calories they burn and underestimating the amount they take in. Unfortunately, your system is biologically programmed to hold on to extra pounds and that means when you start exercising, your body senses the deficit and ramps up its hunger signals. If you're not diligent, you'll eat everything you burn and then some. Use it, to lose it. Cardio gets all the exercise glory, but strength and interval training are the real heroes. They help you build lean muscle, which in turn increases your metabolism and calorie-burning ability 3. Don't Overreact to Mild Hunger Some people have a hard time losing weight because of hunger anxiety. To them, being hungry is  bad-something to be avoided at all costs-so they carry snacks with them and eat when they don't need to. Others eat because they're stressed out or bored. While you never want to get to the point of being ravenous (that's when bingeing is likely to happen), a hunger pang, a craving, or the fact that it's 3:00 p.m. should not send you racing for the vending machine or obsessing about the energy bar in your purse. Ideally, you should put off eating until your stomach is growling and it's difficult to concentrate.  Use it to lose it. When you feel the urge to eat, use the HALT method. Ask yourself, Am I really hungry? Or am I angry or anxious, lonely or bored, or tired? If you're still not certain, try the apple test. If you're truly hungry, an apple should seem delicious; if it doesn't, something else is going on. Or you can  try drinking water and making yourself busy, if you are still hungry try a healthy snack.  4. Not All Calories Are Created Equal The mechanics of weight loss are pretty simple: Take in fewer calories than you use for energy. But the kind of food you eat makes all the difference. Processed food that's high in saturated fat and refined starch or sugar can cause inflammation that disrupts the hormone signals that tell your brain you're full. The result: You eat a lot more.  Use it to lose it. Clean up your diet. Swap in whole, unprocessed foods, including vegetables, lean protein, and healthy fats that will fill you up and give you the biggest nutritional bang for your calorie buck. In a few weeks, as your brain starts receiving regular hunger and fullness signals once again, you'll notice that you feel less hungry overall and naturally start cutting back on the amount you eat.  5. Protein, Produce, and Plant-Based Fats Are Your Weight-Loss Trinity Here's why eating the three Ps regularly will help you drop pounds. Protein fills you up. You need it to build lean muscle, which keeps your  metabolism humming so that you can torch more fat. People in a weight-loss program who ate double the recommended daily allowance for protein (about 110 grams for a 150-pound woman) lost 70 percent of their weight from fat, while people who ate the RDA lost only about 40 percent, one study found. Produce is packed with filling fiber. "It's very difficult to consume too many calories if you're eating a lot of vegetables. Example: Three cups of broccoli is a lot of food, yet only 93 calories. (Fruit is another story. It can be easy to overeat and can contain a lot of calories from sugar, so be sure to monitor your intake.) Plant-based fats like olive oil and those in avocados and nuts are healthy and extra satiating.  Use it to lose it. Aim to incorporate each of the three Ps into every meal and snack. People who eat protein throughout the day are able to keep weight off, according to a study in the [place] of Clinical Nutrition. In addition to meat, poultry and seafood, good sources are beans, lentils, eggs, tofu, and yogurt. As for fat, keep portion sizes in check by measuring out salad dressing, oil, and nut butters (shoot for one to two tablespoons). Finally, eat veggies or a little fruit at every meal. People who did that consumed 308 fewer calories but didn't feel any hungrier than when they didn't eat more produce.  7. How You Eat Is As Important As What You Eat In order for your brain to register that you're full, you need to focus on what you're eating. Sit down whenever you eat, preferably at a table. Turn off the TV or computer, put down your phone, and look at your food. Smell it. Chew slowly, and don't put another bite on your fork until you swallow. When women ate lunch this attentively, they consumed 30 percent less when snacking later than those who listened to an audiobook at lunchtime, according to a study in the [place] of Nutrition. 8. Weighing Yourself Really Works The  scale provides the best evidence about whether your efforts are paying off. Seeing the numbers tick up or down or stagnate is motivation to keep going-or to rethink your approach. A 2015 study at [facility] found that daily weigh-ins helped people lose more weight, keep it off, and maintain that loss, even after two  years. Use it to lose it. Step on the scale at the same time every day for the best results. If your weight shoots up several pounds from one weigh-in to the next, don't freak out. Eating a lot of salt the night before or having your period is the likely culprit. The number should return to normal in a day or two. It's a steady climb that you need to do something about. 9. Too Much Stress and Too Little Sleep Are Your Enemies When you're tired and frazzled, your body cranks up the production of cortisol, the stress hormone that can cause carb cravings. Not getting enough sleep also boosts your levels of ghrelin, a hormone associated with hunger, while suppressing leptin, a hormone that signals fullness and satiety. People on a diet who slept only five and a half hours a night for two weeks lost 55 percent less fat and were hungrier than those who slept eight and a half hours, according to a study in the [place]. Use it to lose it. Prioritize sleep, aiming for seven hours or more a night, which research shows helps lower stress. And make sure you're getting quality zzz's. If a snoring spouse or a fidgety cat wakes you up frequently throughout the night, you may end up getting the equivalent of just four hours of sleep, according to a study from [facility]. Keep pets out of the bedroom, and use a white-noise app to drown out snoring. 10. You Will Hit a plateau-And You Can Bust Through It As you slim down, your body releases much less leptin, the fullness hormone.  If you're not strength training, start right now. Building muscle can raise your metabolism  to help you overcome a plateau. To keep your body challenged and burning calories, incorporate new moves and more intense intervals into your workouts or add another sweat session to your weekly routine. Alternatively, cut an extra 100 calories or so a day from your diet. Now that you've lost weight, your body simply doesn't need as much fuel.    Since food equals calories, in order to lose weight you must either eat fewer calories, exercise more to burn off calories with activity, or both. Food that is not used to fuel the body is stored as fat. A major component of losing weight is to make smarter food choices. Here's how:  1)   Limit non-nutritious foods, such as: Sugar, honey, syrups and candy Pastries, donuts, pies, cakes and cookies Soft drinks, sweetened juices and alcoholic beverages  2)  Cut down on high-fat foods by: - Choosing poultry, fish or lean red meat - Choosing low-fat cooking methods, such as baking, broiling, steaming, grilling and boiling - Using low-fat or non-fat dairy products - Using vinaigrette, herbs, lemon or fat-free salad dressings - Avoiding fatty meats, such as bacon, sausage, franks, ribs and luncheon meats - Avoiding high-fat snacks like nuts, chips and chocolate - Avoiding fried foods - Using less butter, margarine, oil and mayonnaise - Avoiding high-fat gravies, cream sauces and cream-based soups  3) Eat a variety of foods, including: - Fruit and vegetables that are raw, steamed or baked - Whole grains, breads, cereal, rice and pasta - Dairy products, such as low-fat or non-fat milk or yogurt, low-fat cottage cheese and low-fat cheese - Protein-rich foods like chicken, [place], fish, lean meat and legumes, or beans  4) Change your eating habits by: - Eat three balanced meals a day to help control your hunger -  Watch portion sizes and eat small servings of a variety of foods - Choose low-calorie snacks - Eat only when you are hungry and stop when you  are satisfied - Eat slowly and try not to perform other tasks while eating - Find other activities to distract you from food, such as walking, taking up a hobby or being involved in the community - Include regular exercise in your daily routine ( minimum of 20 min of moderate-intensity exercise at least 5 days/week)  - Find a support group, if necessary, for emotional support in your weight loss journey           Easy ways to cut 100 calories   1. Eat your eggs with hot sauce OR salsa instead of cheese.  Eggs are great for breakfast, but many people consider eggs and cheese to be BFFs. Instead of cheese-1 oz. of cheddar has 114 calories-top your eggs with hot sauce, which contains no calories and helps with satiety and metabolism. Salsa is also a great option!!  2. Top your toast, waffles or pancakes with fresh berries instead of jelly or syrup. Half a cup of berries-fresh, frozen or thawed-has about 40 calories, compared with 2 tbsp. of maple syrup or jelly, which both have about 100 calories. The berries will also give you a good punch of fiber, which helps keep you full and satisfied and won't spike blood sugar quickly like the jelly or syrup. 3. Swap the non-fat latte for black coffee with a splash of half-and-half. Contrary to its name, that non-fat latte has 130 calories and a startling 19g of carbohydrates per 16 oz. serving. Replacing that 'light' drinkable dessert with a black coffee with a splash of half-and-half saves you more than 100 calories per 16 oz. serving. 4. Sprinkle salads with freeze-dried raspberries instead of dried cranberries. If you want a sweet addition to your nutritious salad, stay away from dried cranberries. They have a whopping 130 calories per  cup and 30g carbohydrates. Instead, sprinkle freeze-dried raspberries guilt-free and save more than 100 calories per  cup serving, adding 3g of belly-filling fiber. 5. Go for mustard in place of mayo on your  sandwich. Mustard can add really nice flavor to any sandwich, and there are tons of varieties, from spicy to honey. A serving of mayo is 95 calories, versus 10 calories in a serving of mustard.  Or try an avocado mayo spread: You can find the recipe few click this link: https://www.californiaavocado.com/recipes/recipe-container/[place]-avocado-mayo 6. Choose a DIY salad dressing instead of the store-bought kind. Mix Dijon or whole grain mustard with low-fat Kefir or red wine vinegar and garlic. 7. Use hummus as a spread instead of a dip. Use hummus as a spread on a high-fiber cracker or tortilla with a sandwich and save on calories without sacrificing taste. 8. Pick just one salad "accessory." Salad isn't automatically a calorie winner. It's easy to over-accessorize with toppings. Instead of topping your salad with nuts, avocado and cranberries (all three will clock in at 313 calories), just pick one. The next day, choose a different accessory, which will also keep your salad interesting. You don't wear all your jewelry every day, right? 9. Ditch the white pasta in favor of spaghetti squash. One cup of cooked spaghetti squash has about 40 calories, compared with traditional spaghetti, which comes with more than 200. Spaghetti squash is also nutrient-dense. It's a good source of fiber and Vitamins A and C, and it can be eaten just like you would eat pasta-with  a great tomato sauce and [place] meatballs or with pesto, tofu and spinach, for example. 10. Dress up your chili, soups and stews with non-fat [place] yogurt instead of sour cream. Just a 'dollop' of sour cream can set you back 115 calories and a whopping 12g of fat-seven of which are of the artery-clogging variety. Added bonus: [place] yogurt is packed with muscle-building protein, calcium and B Vitamins. 11. Mash cauliflower instead of mashed potatoes. One cup of traditional mashed potatoes-in all their creamy goodness-has more than 200 calories,  compared to mashed cauliflower, which you can typically eat for less than 100 calories per 1 cup serving. Cauliflower is a great source of the antioxidant indole-3-carbinol (I3C), which may help reduce the risk of some cancers, like breast cancer. 12. Ditch the ice cream sundae in favor of a [place] yogurt parfait. Instead of a cup of ice cream or fro-yo for dessert, try 1 cup of nonfat Greek yogurt topped with fresh berries and a sprinkle of cacao nibs. Both toppings are packed with antioxidants, which can help reduce cellular inflammation and oxidative damage. And the comparison is a no-brainer: One cup of ice cream has about 275 calories; one cup of frozen yogurt has about 230; and a cup of Greek yogurt has just 130, plus twice the protein, so you're less likely to return to the freezer for a second helping. 13. Put olive oil in a spray container instead of using it directly from the bottle. Each tablespoon of olive oil is 120 calories and 15g of fat. Use a mister instead of pouring it straight into the pan or onto a salad. This allows for portion control and will save you more than 100 calories. 14. When baking, substitute canned pumpkin for butter or oil. Canned pumpkin-not pumpkin pie mix-is loaded with Vitamin A, which is important for skin and eye health, as well as immunity. And the comparisons are pretty crazy:  cup of canned pumpkin has about 40 calories, compared to butter or oil, which has more than 800 calories. Yes, 800 calories. Applesauce and mashed banana can also serve as good substitutions for butter or oil, usually in a 1:1 ratio. 15. Top casseroles with high-fiber cereal instead of breadcrumbs. Breadcrumbs are typically made with white bread, while breakfast cereals contain 5-9g of fiber per serving. Not only will you save more than 150 calories per  cup serving, the swap will also keep you more full and you'll get a metabolism boost from the added fiber. 16. Snack on pistachios  instead of macadamia nuts. Believe it or not, you get the same amount of calories from 35 pistachios (100 calories) as you would from only five macadamia nuts. 17. Chow down on kale chips rather than potato chips. This is my favorite 'don't knock it 'till you try it' swap. Kale chips are so easy to make at home, and you can spice them up with a little grated parmesan or chili powder. Plus, they're a mere fraction of the calories of potato chips, but with the same crunch factor we crave so often. 18. Add seltzer and some fruit slices to your cocktail instead of soda or fruit juice. One cup of soda or fruit juice can pack on as much as 140 calories. Instead, use seltzer and fruit slices. The fruit provides valuable phytochemicals, such as flavonoids and anthocyanins, which help to combat cancer and stave off the aging process.

## 2018-04-16 NOTE — Progress Notes (Signed)
Impression and Recommendations:    1. Essential hypertension   2. Obesity, Class I, BMI 30-34.9   3. Stress in life due to family/ household     1. Essential Hypertension  - Symptoms controlled at this time. - Continue treatment plan as recommended.  See med list below. - Patient tolerating meds well without complication.  Denies S-E  - Lifestyle changes such as dash diet and engaging in a regular exercise program discussed with patient.  Educational handouts provided at patient's desire.  - Continued ambulatory BP monitoring encouraged.  Keep log and bring in next OV.  2. BMI Counseling Explained to patient what BMI refers to, and what it means medically.    Told patient to think about it as a "medical risk stratification measurement" and how increasing BMI is associated with increasing risk/ or worsening state of various diseases such as hypertension, hyperlipidemia, diabetes, premature OA, depression etc.  American Heart Association guidelines for healthy diet, basically Mediterranean diet, and exercise guidelines of 30 minutes 5 days per week or more discussed in detail.  Health counseling performed.  All questions answered.  3. Lifestyle & Preventative Health Maintenance - Advised patient to continue working toward exercising to improve overall mental, physical, and emotional health.    - Encouraged patient to engage in daily physical activity, especially a formal exercise routine.  Recommended that the patient eventually strive for at least 150 minutes of moderate cardiovascular activity per week according to guidelines established by the [facility].   - Encouraged beginning a program like Weight Watchers or other exercise/intake programs.  - Healthy dietary habits encouraged, including low-carb, and high amounts of lean protein in diet.   - Patient should also consume adequate amounts of water - half of body weight in oz of water per day.   Education and routine counseling  performed. Handouts provided.  4. Follow-Up - Discussed need for blood work today.  Orders placed today.  - Return in 4 months for blood pressure follow up, or PRN for acute concerns.  - If patient desires assistance with weight loss, patient knows she can begin following up here at the clinic for consultation and guidance.   Orders Placed This Encounter  Procedures  . Comprehensive metabolic panel    No orders of the defined types were placed in this encounter.   Medications Discontinued During This Encounter  Medication Reason  . amLODIPine-Valsartan-HCTZ 6-568-12.7 MG TABS Duplicate     The patient was counseled, risk factors were discussed, anticipatory guidance given.  Gross side effects, risk and benefits, and alternatives of medications discussed with patient.  Patient is aware that all medications have potential side effects and we are unable to predict every side effect or drug-drug interaction that may occur.  Expresses verbal understanding and consents to current therapy plan and treatment regimen.  Return for 60mo for BP etc or sooner prn or wt loss etc.  Please see AVS handed out to patient at the end of our visit for further patient instructions/ counseling done pertaining to today's office visit.    Note:  This document was prepared using Dragon voice recognition software and may include unintentional dictation errors.   This document serves as a record of services personally performed by Mellody Dance, DO. It was created on her behalf by Toni Amend, a trained medical scribe. The creation of this record is based on the scribe's personal observations and the provider's statements to them.   I have reviewed the above  medical documentation for accuracy and completeness and I concur.  Mellody Dance 04/16/18 10:39 AM    Subjective:    HPI: Sheri Cole is a 47 y.o. female who presents to [place] at [facility] today for follow up  for HTN.    Notes she's been well.  She has been seeing a man since December.  Patient's main concern right now is her desire to lose a bit of weight.  She experiences some significant stress raising her children on her own as a widow, but she is proud of herself, her children, and her accomplishments as a single mother.  HTN:  -  Her blood pressure has been controlled at home.   Patient has been trying to walk, about two miles 2-3 days per week.  Notes that her goal is "more than not."  - Patient reports good compliance with blood pressure medications  - Denies medication S-E.  Has stayed at a half dose because her blood pressure has been perfect at home.  Denies side effects on medication.  Notes that her blood pressure spikes a bit when she receives phone calls from customers at 6 or 7 AM, but is otherwise good.  BP Range at Home 116/73 135/83 124/76 125/76 113/69 116/71 122/72 151/97 128/85 136/87 119/71   - Smoking Status noted   - She denies new onset of: chest pain, exercise intolerance, shortness of breath, dizziness, visual changes, headache, lower extremity swelling or claudication.   Last 3 blood pressure readings in our office are as follows: BP Readings from Last 3 Encounters:  04/16/18 122/83  03/05/18 (!) 155/95  01/26/18 (!) 147/92    Pulse Readings from Last 3 Encounters:  04/16/18 (!) 59  03/05/18 (!) 57  01/26/18 60    Filed Weights   04/16/18 1012  Weight: 222 lb 11.2 oz (101 kg)      Patient Care Team    Relationship Specialty Notifications Start End  Mellody Dance, DO PCP - General Family Medicine  01/26/18   Obgyn, Erling Conte Attending Physician Gynecology  01/26/18    Comment: Patient sees Dr. Valentino Saxon at [facility] for her routine GYN care  Earnie Larsson, MD Consulting Physician Neurosurgery  01/26/18    Comment: Sees patient for discogenic lumbar disease and radiculopathy  Lavonna Monarch, MD Consulting Physician  Dermatology  01/26/18   Aloha Gell, MD Consulting Physician Obstetrics and Gynecology  01/26/18      Lab Results  Component Value Date   CREATININE 0.73 03/02/2018   BUN 13 03/02/2018   NA 143 03/02/2018   K 4.5 03/02/2018   CL 105 03/02/2018   CO2 23 03/02/2018    Lab Results  Component Value Date   CHOL 155 03/02/2018    Lab Results  Component Value Date   HDL 47 03/02/2018    Lab Results  Component Value Date   LDLCALC 90 03/02/2018    Lab Results  Component Value Date   TRIG 90 03/02/2018    Lab Results  Component Value Date   CHOLHDL 3.3 03/02/2018    No results found for: LDLDIRECT ===================================================================   Patient Active Problem List   Diagnosis Date Noted  . Essential hypertension 03/05/2018    Priority: High  . History of hypertension-   did diet and lifestyle modification to better control in the past 01/26/2018    Priority: High  . History of hyperlipidemia 01/26/2018    Priority: High  . Obesity, Class I, BMI 30-34.9 01/26/2018  Priority: Medium  . History of vitamin D deficiency 02/16/2018    Priority: Medium  . History of episode of depression- on wellbutrin in past due to stress of ex-husband 02/16/2018    Priority: Medium  . Family history of diabetes mellitus in father- in 75's; [place] Feb 16, 2018    Priority: Low  . Family history of lung cancer-father died age 48 history of heavy smoking 02/16/2018    Priority: Low  . Widowed-  husband died Suicide 5 yrs ago February 16, 2018    Priority: Low  . Family history of breast cancer in female Feb 16, 2018    Priority: Low  . Chronic left-sided low back pain with left-sided sciatica February 16, 2018    Priority: Low  . Lumbar discogenic pain syndrome 02-16-2018    Priority: Low  . Stress in life due to family/ household 03/05/2018  . Elevated blood pressure reading 16-Feb-2018  . Facet arthropathy, lumbosacral 2018/02/16  . Elevated blood pressure  07/13/2015  . Herpes zoster 07/13/2015     Past Medical History:  Diagnosis Date  . Endometrial polyp 2016  . Vaginal delivery 2005, 2008, 2010     Past Surgical History:  Procedure Laterality Date  . DILATATION & CURETTAGE/HYSTEROSCOPY WITH MYOSURE N/A 10/14/2014   Procedure: DILATATION & CURETTAGE/HYSTEROSCOPY WITH MYOSURE;  Surgeon: Floyce Stakes. Pamala Hurry, MD;  Location: [facility];  Service: Gynecology;  Laterality: N/A;  . NO PAST SURGERIES       Family History  Problem Relation Age of Onset  . Cancer Father        metastatic lung (smoker)  . Diabetes Father   . Cancer Maternal Grandmother        breast, leukemia  . Cancer Paternal Grandmother        ovarian or cervical  . Hypertension Neg Hx   . CAD Neg Hx   . Stroke Neg Hx      Social History   Substance and Sexual Activity  Drug Use No  ,  Social History   Substance and Sexual Activity  Alcohol Use Yes  . Alcohol/week: 0.0 standard drinks   Comment: occasionally  ,  Social History   Tobacco Use  Smoking Status Never Smoker  Smokeless Tobacco Never Used  ,    Current Outpatient Medications on File Prior to Visit  Medication Sig Dispense Refill  . amLODIPine-Valsartan-HCTZ 5-160-12.5 MG TABS Take 1 tablet by mouth daily. 90 tablet 1  . Calcium Carb-Cholecalciferol (CALCIUM 500+D) 500-200 MG-UNIT TABS Take 1 tablet by mouth daily.    . vitamin B-12 (CYANOCOBALAMIN) 1000 MCG tablet Take 1,000 mcg by mouth daily.     No current facility-administered medications on file prior to visit.      Allergies  Allergen Reactions  . Sulfonamide Derivatives Hives     Review of Systems:   General:  Denies fever, chills Optho/Auditory:   Denies visual changes, blurred vision Respiratory:   Denies SOB, cough, wheeze, DIB  Cardiovascular:   Denies chest pain, palpitations, painful respirations Gastrointestinal:   Denies nausea, vomiting, diarrhea.  Endocrine:     Denies new hot or cold  intolerance Musculoskeletal:  Denies joint swelling, gait issues, or new unexplained myalgias/ arthralgias Skin:  Denies rash, suspicious lesions  Neurological:    Denies dizziness, unexplained weakness, numbness  Psychiatric/Behavioral:   Denies mood changes  Objective:    Blood pressure 122/83, pulse (!) 59, height 5\' 8"  (1.727 m), weight 222 lb 11.2 oz (101 kg), SpO2 100 %.  Body mass index is 33.86 kg/m.  General: Well Developed, well nourished, and in no acute distress.  HEENT: Normocephalic, atraumatic, pupils equal round reactive to light, neck supple, No carotid bruits, no JVD Skin: Warm and dry, cap RF less 2 sec Cardiac: Regular rate and rhythm, S1, S2 WNL's, no murmurs rubs or gallops Respiratory: ECTA B/L, Not using accessory muscles, speaking in full sentences. NeuroM-Sk: Ambulates w/o assistance, moves ext * 4 w/o difficulty, sensation grossly intact.  Ext: scant edema b/l lower ext Psych: No HI/SI, judgement and insight good, Euthymic mood. Full Affect.

## 2018-04-17 LAB — COMPREHENSIVE METABOLIC PANEL
ALBUMIN: 4.2 g/dL (ref 3.5–5.5)
ALK PHOS: 31 IU/L — AB (ref 39–117)
ALT: 14 IU/L (ref 0–32)
AST: 15 IU/L (ref 0–40)
Albumin/Globulin Ratio: 1.6 (ref 1.2–2.2)
BILIRUBIN TOTAL: 0.4 mg/dL (ref 0.0–1.2)
BUN / CREAT RATIO: 19 (ref 9–23)
BUN: 15 mg/dL (ref 6–24)
CHLORIDE: 101 mmol/L (ref 96–106)
CO2: 24 mmol/L (ref 20–29)
CREATININE: 0.81 mg/dL (ref 0.57–1.00)
Calcium: 9.5 mg/dL (ref 8.7–10.2)
GFR calc non Af Amer: 88 mL/min/{1.73_m2} (ref >59)
GFR, EST AFRICAN AMERICAN: 101 mL/min/{1.73_m2} (ref >59)
GLOBULIN, TOTAL: 2.6 g/dL (ref 1.5–4.5)
GLUCOSE: 91 mg/dL (ref 65–99)
Potassium: 4.4 mmol/L (ref 3.5–5.2)
SODIUM: 141 mmol/L (ref 134–144)
Total Protein: 6.8 g/dL (ref 6.0–8.5)

## 2018-07-14 DIAGNOSIS — Z1159 Encounter for screening for other viral diseases: Secondary | ICD-10-CM | POA: Diagnosis not present

## 2018-07-14 DIAGNOSIS — Z6835 Body mass index (BMI) 35.0-35.9, adult: Secondary | ICD-10-CM | POA: Diagnosis not present

## 2018-07-14 DIAGNOSIS — Z118 Encounter for screening for other infectious and parasitic diseases: Secondary | ICD-10-CM | POA: Diagnosis not present

## 2018-07-14 DIAGNOSIS — Z01419 Encounter for gynecological examination (general) (routine) without abnormal findings: Secondary | ICD-10-CM | POA: Diagnosis not present

## 2018-07-14 DIAGNOSIS — Z1231 Encounter for screening mammogram for malignant neoplasm of breast: Secondary | ICD-10-CM | POA: Diagnosis not present

## 2018-07-14 DIAGNOSIS — Z113 Encounter for screening for infections with a predominantly sexual mode of transmission: Secondary | ICD-10-CM | POA: Diagnosis not present

## 2018-07-14 DIAGNOSIS — Z114 Encounter for screening for human immunodeficiency virus [HIV]: Secondary | ICD-10-CM | POA: Diagnosis not present

## 2018-07-29 ENCOUNTER — Other Ambulatory Visit: Payer: Self-pay | Admitting: Obstetrics

## 2018-07-29 DIAGNOSIS — N6320 Unspecified lump in the left breast, unspecified quadrant: Secondary | ICD-10-CM

## 2018-08-04 ENCOUNTER — Ambulatory Visit (INDEPENDENT_AMBULATORY_CARE_PROVIDER_SITE_OTHER): Payer: BLUE CROSS/BLUE SHIELD | Admitting: Family Medicine

## 2018-08-04 ENCOUNTER — Encounter: Payer: Self-pay | Admitting: Family Medicine

## 2018-08-04 VITALS — BP 127/80 | HR 61 | Temp 98.4°F | Ht 67.5 in | Wt 227.2 lb

## 2018-08-04 DIAGNOSIS — J069 Acute upper respiratory infection, unspecified: Secondary | ICD-10-CM

## 2018-08-04 DIAGNOSIS — J029 Acute pharyngitis, unspecified: Secondary | ICD-10-CM

## 2018-08-04 LAB — POCT RAPID STREP A (OFFICE): RAPID STREP A SCREEN: NEGATIVE

## 2018-08-04 NOTE — Patient Instructions (Addendum)
Melissa, please send her swab for throat culture.   Upper respiratory viral infection, Adult Adenoviruses are common viruses that cause many different types of infections. The viruses usually affect the lungs, but they can also affect other parts of the body, including the eyes, stomach, bowels, bladder, and brain. The most common type of adenovirus infection is the common cold. Usually, adenovirus infections are not severe unless you have another health problem that makes it hard for your body to fight off infection. What are the causes? You can get this condition if you:  Touch a surface or object that has an adenovirus on it and then touch your mouth, nose, or eyes with unwashed hands.  Come into close physical contact with an infected person, such as by hugging or shaking hands.  Breathe in droplets that fly through the air when an infected person talks, coughs, or sneezes.  Have contact with infected stool.  Swim in a pool that does not have enough chlorine.  Adenoviruses can live outside the body for many weeks. They spread easily from person to person (are contagious). What increases the risk? This condition is more likely to develop in:  People who spend a lot of time in places where there are many people, such as schools, summer camps, daycare centers, community centers, and [employer] recruit training centers.  Elderly adults.  People with a weak body defense system (immune system).  People with a lung disease.  People with a heart condition.  What are the signs or symptoms? Adenovirus infections usually cause flu-like symptoms. Once the virus gets into the body, symptoms of this condition can take up to 14 days to develop. Symptoms may include:  Headache.  Stiff neck.  Sleepiness or fatigue.  Confusion or disorientation.  Fever.  Sore throat.  Cough.  Trouble breathing.  Runny nose or congestion.  Pink eye (conjunctivitis).  Bleeding into the covering  of the eye.  Stomachache or diarrhea.  Nausea or vomiting.  Blood in the urine or pain while urinating.  Ear pain or fullness.  How is this diagnosed? This condition may be diagnosed based on your symptoms and a physical exam. Your health care provider may order tests to make sure your symptoms are not caused by another type of problem. Tests can include:  Blood tests.  Urine tests.  Stool tests.  Chest X-ray.  Tissue or throat culture.  How is this treated? This condition goes away on its own with time. Treatment for this condition involves managing symptoms until the condition goes away. Your health care provider may recommend:  Rest.  Drinking more fluids.  Taking over-the-counter medicine to help relieve a sore throat, fever, or headache.  Follow these instructions at home:  Rest at home until your symptoms go away.  Drink enough fluid to keep your urine clear or pale yellow.  Take over-the-counter and prescription medicines only as told by your health care provider.  Keep all follow-up visits as told by your health care provider. This is important. How is this prevented? Adenoviruses are resistant to many cleaning products and can remain on surfaces for long periods of time. To help prevent infection:  Wash your hands often with soap and water.  Cover your nose or mouth when you sneeze or cough.  Do not touch your eyes, nose, or mouth with unwashed hands.  Clean commonly used objects often.  Do not swim in a pool that is not properly chlorinated.  Avoid close contact with people who are  sick.  Do not go to school or work when you are sick.  Contact a health care provider if:  Your symptoms do not improve after 10 days.  Your symptoms get worse.  You cannot eat or drink without vomiting. Get help right away if:  You have trouble breathing or you are breathing rapidly.  Your skin, lips, or fingernails look blue (cyanosis).  You have a rapid  heart rate.  You become confused.  You lose consciousness. This information is not intended to replace advice given to you by your health care provider. Make sure you discuss any questions you have with your health care provider. Document Released: 11/16/2002 Document Revised: 04/22/2016 Document Reviewed: 04/22/2016 Elsevier Interactive Patient Education  [employer].

## 2018-08-04 NOTE — Progress Notes (Signed)
Acute Care Office visit  Assessment and plan:  1. Pharyngitis, unspecified etiology   2. Viral upper respiratory tract infection     1. Sore Throat; Upper Respiratory Sx - Strep test taken and cultured today.  -  Viral vs Allergic vs Bacterial causes for pt's symptoms reveiwed.     - Supportive care and various OTC medications discussed in addition to any prescribed.  Patient knows to rest over the holiday.  - Patient may use chloraseptic spray to help with throat pain.  - Call or RTC if new symptoms, or if no improvement or worse over next several days.    - Will consider ABX if sx continue past 10 days and worsening if not already given.   Orders Placed This Encounter  Procedures  . Culture, Group A Strep  . POCT rapid strep A    Gross side effects, risk and benefits, and alternatives of medications discussed with patient.  Patient is aware that all medications have potential side effects and we are unable to predict every sideeffect or drug-drug interaction that may occur.  Expresses verbal understanding and consents to current therapy plan and treatment regiment.   Education and routine counseling performed. Handouts provided.  Anticipatory guidance and routine counseling done re: condition, txmnt options and need for follow up. All questions of patient's were answered.  Return needs updated photo please, for f/up for chornic care as previously discussed.  Please see AVS handed out to patient at the end of our visit for additional patient instructions/ counseling done pertaining to today's office visit.  Note:  This document was partially repared using Dragon voice recognition software and may include unintentional dictation errors.  This document serves as a record of services personally performed by Mellody Dance, DO. It was created on her behalf by Toni Amend, a trained medical scribe. The creation of this record is based on the scribe's personal  observations and the provider's statements to them.   I have reviewed the above medical documentation for accuracy and completeness and I concur.  Mellody Dance, DO 08/04/2018 2:35 PM       Subjective:    Chief Complaint  Patient presents with  . Sore Throat    HPI:  Pt presents with Sx for 2 days, started yesterday.   C/o: Woke with a headache yesterday; felt a lump in her throat.  Has had a sore throat.  Last night, her throat hurt really bad, "never had a sore throat hurt this bad."  Gargled with Listerine, "no matter what water she sipped or what, it hurt."  Notes she was up all night with it.  Had a little bit of a cough yesterday, coughing up phlegm/gunk.  Denies: Exposure to anyone sick, denies fevers.  Denies one-sided face pain.    For symptoms patient has tried: Gargling with Listerine.  Overall Getting: States that her sore throat is feeling better than it was last night.  Patient has been under stress preparing for her vacation this week.  Notes she will be with her kids starting tomorrow.    Patient Care Team    Relationship Specialty Notifications Start End  Mellody Dance, DO PCP - General Family Medicine  01/26/18   Obgyn, Erling Conte Attending Physician Gynecology  01/26/18    Comment: Patient sees Dr. Valentino Saxon at [facility] for her routine GYN care  Earnie Larsson, MD Consulting Physician Neurosurgery  01/26/18    Comment: Sees patient for discogenic lumbar disease and radiculopathy  Lavonna Monarch, MD Consulting Physician Dermatology  01/26/18   Aloha Gell, MD Consulting Physician Obstetrics and Gynecology  01/26/18     Past medical history, Surgical history, Family history reviewed and noted below, Social history, Allergies, and Medications have been entered into the medical record, reviewed and changed as needed.   Allergies  Allergen Reactions  . Sulfonamide Derivatives Hives    Review of Systems: - see above HPI for pertinent  positives General:   No F/C, wt loss Pulm:   No DIB, pleuritic chest pain Card:  No CP, palpitations Abd:  No n/v/d or pain Ext:  No inc edema from baseline   Objective:   Blood pressure 127/80, pulse 61, temperature 98.4 F (36.9 C), height 5' 7.5" (1.715 m), weight 227 lb 3.2 oz (103.1 kg), SpO2 99 %. Body mass index is 35.06 kg/m. General: Well Developed, well nourished, appropriate for stated age.  Neuro: Alert and oriented x3, extra-ocular muscles intact, sensation grossly intact.  HEENT: Normocephalic, atraumatic, pupils equal round reactive to light, neck supple, no masses, no painful lymphadenopathy, TM's intact B/L, no acute findings. Nares- patent, clear d/c, OP- clear, mild erythema, No TTP sinuses Skin: Warm and dry, no gross rash. Cardiac: RRR, S1 S2,  no murmurs rubs or gallops.  Respiratory: ECTA B/L and A/P, Not using accessory muscles, speaking in full sentences- unlabored. Vascular:  No gross lower ext edema, cap RF less 2 sec. Psych: No HI/SI, judgement and insight good, Euthymic mood. Full Affect.

## 2018-08-07 LAB — CULTURE, GROUP A STREP: Strep A Culture: NEGATIVE

## 2018-08-11 ENCOUNTER — Other Ambulatory Visit: Payer: Self-pay | Admitting: Dermatology

## 2018-08-11 DIAGNOSIS — D229 Melanocytic nevi, unspecified: Secondary | ICD-10-CM | POA: Diagnosis not present

## 2018-08-11 DIAGNOSIS — L570 Actinic keratosis: Secondary | ICD-10-CM | POA: Diagnosis not present

## 2018-08-11 DIAGNOSIS — D485 Neoplasm of uncertain behavior of skin: Secondary | ICD-10-CM | POA: Diagnosis not present

## 2018-08-14 ENCOUNTER — Ambulatory Visit
Admission: RE | Admit: 2018-08-14 | Discharge: 2018-08-14 | Disposition: A | Discharge status: Home / Self Care | Payer: BLUE CROSS/BLUE SHIELD | Source: Ambulatory Visit | Attending: Obstetrics | Admitting: Obstetrics

## 2018-08-14 DIAGNOSIS — N6320 Unspecified lump in the left breast, unspecified quadrant: Secondary | ICD-10-CM

## 2018-08-14 DIAGNOSIS — R922 Inconclusive mammogram: Secondary | ICD-10-CM | POA: Diagnosis not present

## 2018-08-14 DIAGNOSIS — N6321 Unspecified lump in the left breast, upper outer quadrant: Secondary | ICD-10-CM | POA: Diagnosis not present

## 2018-08-17 ENCOUNTER — Encounter: Payer: Self-pay | Admitting: Family Medicine

## 2018-08-17 ENCOUNTER — Ambulatory Visit (INDEPENDENT_AMBULATORY_CARE_PROVIDER_SITE_OTHER): Payer: BLUE CROSS/BLUE SHIELD | Admitting: Family Medicine

## 2018-08-17 VITALS — BP 121/79 | HR 79 | Temp 98.1°F | Ht 68.0 in | Wt 225.8 lb

## 2018-08-17 DIAGNOSIS — F341 Dysthymic disorder: Secondary | ICD-10-CM

## 2018-08-17 DIAGNOSIS — I10 Essential (primary) hypertension: Secondary | ICD-10-CM

## 2018-08-17 DIAGNOSIS — Z6379 Other stressful life events affecting family and household: Secondary | ICD-10-CM

## 2018-08-17 MED ORDER — FLUOXETINE HCL 10 MG PO TABS: ORAL_TABLET | ORAL | 0 refills | Status: DC

## 2018-08-17 NOTE — Patient Instructions (Addendum)
We reviewed weaning up your dose in office today.  Please start with 10 mg of the Prozac daily, for 3-4 days.  If tolerating well, increase dose to two tablets, or 20 mg.  If after a couple of weeks, you feel better, but not good enough, you may increase your dose to 40 mg per day.  In-between this, you may increase to 30 mg and see how that makes you feel.      What causes depression?  You may already know that depression (MDD) is a complex medical condition. The exact cause is unknown, but it's most likely a combination of several factors: genetic, biological, environmental, and psychological.  Depression can run in families, but not everyone with depression has a family history. Scientists are trying to identify genes involved to understand the role that genetics may play in depression.  Environmental factors that may play a role in depression include trauma, loss of a loved one, a difficult relationship, or other stressful situations. Some depressive episodes may occur without an obvious trigger.  If you think you may have depression, there is something you can do about it. The first step is to talk to your healthcare professional about your symptoms.  You can also read about the symptoms of depression below to help you get a better understanding about the types of changes you may be experiencing.    Depression and the brain:  It's thought that certain neurotransmitters such as serotonin, norepinephrine, and dopamine (chemicals that the brain uses to communicate) are out of balance when you're depressed  Serotonin is one of the chemicals believed to be affected by depression - it is thought to be involved in the regulation of mood and other functions    What are the symptoms of depression?  Emotional. Physical. Cognitive.  1. Little interest or pleasure in doing things 2. Feeling down, depressed, or hopeless 3. Trouble falling or staying asleep, or sleeping too  much 4. Feeling tired or having little energy 5. Poor appetite, overeating, or considerable weight changes 6. Feeling bad about yourself - that you are a failure or having a lot of guilt 7. Difficulty concentrating on things or making decisions 8. Moving or speaking slowly, so that other people have noticed, or being so restless that you've been moving around a lot 9. Thoughts that you would be better off dead, or of hurting yourself in some way   Depression is a complex medical condition that may make it hard to feel like yourself. Talk to your healthcare professional about all of your symptoms and their impact.   You may be suffering from depression if you are experiencing five or more of the symptoms above including either depressed mood or decreased interest or pleasure. In addition, depression is also associated with:   Symptoms that are new or noticeably worse compared to what they were prior to the episode  Symptoms that persist for most of the day, nearly every day for at least two consecutive weeks  Episodes that are accompanied by clinically significant distress or impaired functioning    Myth:  Depression is something you can just "snap out of".   Fact:  No one chooses to be depressed, and it is not caused by laziness, weakness, or simply feeling sad. Depression is a complex medical condition. The exact cause is unknown, but it's most likely a combination of several factors: genetic, biological, environmental, and psychological. You cannot control your loved ones recovery, but you can support them along  the way.   Myth:  Depression only affects your emotions  Fact:  Depression can be emotional, physical, and cognitive symptoms. It can result in symptoms that include little interest or pleasure in doing things; feeling down, depressed, or hopeless; trouble falling or staying asleep, or sleeping too much; feeling tired or having little energy; poor appetite,  overeating, or considerable weight changes; feeling bad about yourself, like you are a failure or having a lot of guilt; trouble concentrating on things or making decisions; moving or speaking slowly, so that other people have noticed, or being so restless that you've been moving around a lot; and thoughts that you would be better off dead, or of hurting yourself in some way. People with depression do not all experience the same symptoms, and the severity, frequency, and duration can vary    Living With Depression Everyone experiences occasional disappointment, sadness, and loss in their lives. When you are feeling down, blue, or sad for at least 2 weeks in a row, it may mean that you have depression. Depression can affect your thoughts and feelings, relationships, daily activities, and physical health. It is caused by changes in the way your brain functions. If you receive a diagnosis of depression, your health care provider will tell you which type of depression you have and what treatment options are available to you. If you are living with depression, there are ways to help you recover from it and also ways to prevent it from coming back. How to cope with lifestyle changes Coping with stress Stress is your bodys reaction to life changes and events, both good and bad. Stressful situations may include:  Getting married.  The death of a spouse.  Losing a job.  Retiring.  Having a baby.  Stress can last just a few hours or it can be ongoing. Stress can play a major role in depression, so it is important to learn both how to cope with stress and how to think about it differently. Talk with your health care provider or a counselor if you would like to learn more about stress reduction. He or she may suggest some stress reduction techniques, such as:  Music therapy. This can include creating music or listening to music. Choose music that you enjoy and that inspires you.  Mindfulness-based  meditation. This kind of meditation can be done while sitting or walking. It involves being aware of your normal breaths, rather than trying to control your breathing.  Centering prayer. This is a kind of meditation that involves focusing on a spiritual word or phrase. Choose a word, phrase, or sacred image that is meaningful to you and that brings you peace.  Deep breathing. To do this, expand your stomach and inhale slowly through your nose. Hold your breath for 3-5 seconds, then exhale slowly, allowing your stomach muscles to relax.  Muscle relaxation. This involves intentionally tensing muscles then relaxing them.  Choose a stress reduction technique that fits your lifestyle and personality. Stress reduction techniques take time and practice to develop. Set aside 5-15 minutes a day to do them. Therapists can offer training in these techniques. The training may be covered by some insurance plans. Other things you can do to manage stress include:  Keeping a stress diary. This can help you learn what triggers your stress and ways to control your response.  Understanding what your limits are and saying no to requests or events that lead to a schedule that is too full.  Thinking about how  you respond to certain situations. You may not be able to control everything, but you can control how you react.  Adding humor to your life by watching funny films or TV shows.  Making time for activities that help you relax and not feeling guilty about spending your time this way.  Medicines Your health care provider may suggest certain medicines if he or she feels that they will help improve your condition. Avoid using alcohol and other substances that may prevent your medicines from working properly (may interact). It is also important to:  Talk with your pharmacist or health care provider about all the medicines that you take, their possible side effects, and what medicines are safe to take  together.  Make it your goal to take part in all treatment decisions (shared decision-making). This includes giving input on the side effects of medicines. It is best if shared decision-making with your health care provider is part of your total treatment plan.  If your health care provider prescribes a medicine, you may not notice the full benefits of it for 4-8 weeks. Most people who are treated for depression need to be on medicine for at least 6-12 months after they feel better. If you are taking medicines as part of your treatment, do not stop taking medicines without first talking to your health care provider. You may need to have the medicine slowly decreased (tapered) over time to decrease the risk of harmful side effects. Relationships Your health care provider may suggest family therapy along with individual therapy and drug therapy. While there may not be family problems that are causing you to feel depressed, it is still important to make sure your family learns as much as they can about your mental health. Having your familys support can help make your treatment successful. How to recognize changes in your condition Everyone has a different response to treatment for depression. Recovery from major depression happens when you have not had signs of major depression for two months. This may mean that you will start to:  Have more interest in doing activities.  Feel less hopeless than you did 2 months ago.  Have more energy.  Overeat less often, or have better or improving appetite.  Have better concentration.  Your health care provider will work with you to decide the next steps in your recovery. It is also important to recognize when your condition is getting worse. Watch for these signs:  Having fatigue or low energy.  Eating too much or too little.  Sleeping too much or too little.  Feeling restless, agitated, or hopeless.  Having trouble concentrating or making  decisions.  Having unexplained physical complaints.  Feeling irritable, angry, or aggressive.  Get help as soon as you or your family members notice these symptoms coming back. How to get support and help from others How to talk with friends and family members about your condition Talking to friends and family members about your condition can provide you with one way to get support and guidance. Reach out to trusted friends or family members, explain your symptoms to them, and let them know that you are working with a health care provider to treat your depression. Financial resources Not all insurance plans cover mental health care, so it is important to check with your insurance carrier. If paying for co-pays or counseling services is a problem, search for a local or county mental health care center. They may be able to offer public mental health care services  at low or no cost when you are not able to see a private health care provider. If you are taking medicine for depression, you may be able to get the generic form, which may be less expensive. Some makers of prescription medicines also offer help to patients who cannot afford the medicines they need. Follow these instructions at home:  Get the right amount and quality of sleep.  Cut down on using caffeine, tobacco, alcohol, and other potentially harmful substances.  Try to exercise, such as walking or lifting small weights.  Take over-the-counter and prescription medicines only as told by your health care provider.  Eat a healthy diet that includes plenty of vegetables, fruits, whole grains, low-fat dairy products, and lean protein. Do not eat a lot of foods that are high in solid fats, added sugars, or salt.  Keep all follow-up visits as told by your health care provider. This is important. Contact a health care provider if:  You stop taking your antidepressant medicines, and you have any of these  symptoms: ? Nausea. ? Headache. ? Feeling lightheaded. ? Chills and body aches. ? Not being able to sleep (insomnia).  You or your friends and family think your depression is getting worse. Get help right away if:  You have thoughts of hurting yourself or others. If you ever feel like you may hurt yourself or others, or have thoughts about taking your own life, get help right away. You can go to your nearest emergency department or call:  Your local emergency services (911 in the [place]  A suicide crisis helpline, such as the [place] at 316-434-9927. This is open 24-hours a day.  Summary  If you are living with depression, there are ways to help you recover from it and also ways to prevent it from coming back.  Work with your health care team to create a management plan that includes counseling, stress management techniques, and healthy lifestyle habits. This information is not intended to replace advice given to you by your health care provider. Make sure you discuss any questions you have with your health care provider. Document Released: 07/29/2016 Document Revised: 07/29/2016 Document Reviewed: 07/29/2016 Elsevier Interactive Patient Education  [employer].

## 2018-08-17 NOTE — Progress Notes (Signed)
Impression and Recommendations:    1. Essential hypertension   2. Stress in life due to family/ household   3. Dysthymia     1. Hypertension - Blood pressure remains well controlled at this time. - Continue treatment plan as prescribed.  See med list below. - Patient tolerating meds well without complication.  Denies S-E  - Lifestyle changes such as dash diet and engaging in a regular exercise program discussed with patient.  Educational handouts provided PRN.  - Ambulatory BP monitoring encouraged. Keep log and bring in next OV  2. Mood - Mood is not optimally controlled at this time. - Recommended change in treatment plan. - Per pt, was managed on Wellbutrin for 2-3 years in the past.  - Fluoxetine prescribed today.  See med list today. - Discussed weaning up patient's dose from 10 mg to 20 mg, using tablets.   - Patient knows to keep in touch and update [place] as she weans up on her dose.  - Per patient, her daughter is managed on Fluoxetine for anxiety, with success. - Reviewed risks and benefits of medication, including potential S-E.  Patient agrees to begin medication today.  - Counseled patient at length today.  Reviewed the "spokes of the wheel" of mood and health management.  Stressed the importance of ongoing prudent habits, including regular exercise, appropriate sleep hygiene, healthful dietary habits, and prayer/meditation to relax.  - Discussed importance of healthy social support networks through family and friends, management of mood and coping skills through the assistance of counseling, in addition to management through medication.  - Strongly encouraged regular sleep routine.  Discussed need to continue to drink very little caffeine.  - Encouraged patient to engage in a regular exercise routine.  Educated patient that exercise helps to improve mood.  3. BMI Counseling - BMI 34.3 Explained to patient what BMI refers to, and what it means medically.    Told  patient to think about it as a "medical risk stratification measurement" and how increasing BMI is associated with increasing risk/ or worsening state of various diseases such as hypertension, hyperlipidemia, diabetes, premature OA, depression etc.  American Heart Association guidelines for healthy diet, basically Mediterranean diet, and exercise guidelines of 30 minutes 5 days per week or more discussed in detail.  Health counseling performed.  All questions answered.  4. Lifestyle & Preventative Health Maintenance - Advised patient to continue working toward exercising to improve overall mental, physical, and emotional health.    - Encouraged patient to engage in daily physical activity, especially a formal exercise routine.  Recommended that the patient eventually strive for at least 150 minutes of moderate cardiovascular activity per week according to guidelines established by the [facility].   - Healthy dietary habits encouraged, including low-carb, and high amounts of lean protein in diet.   - Patient should also consume adequate amounts of water.   Education and routine counseling performed. Handouts provided.   Meds ordered this encounter  Medications  . FLUoxetine (PROZAC) 10 MG tablet    Sig: Take 1 tablet (10 mg total) by mouth daily for 6 days, THEN 2 tablets (20 mg total) daily for 6 days, THEN 3 tablets (30 mg total) daily.    Dispense:  252 tablet    Refill:  0    Medications Discontinued During This Encounter  Medication Reason  . tinidazole (TINDAMAX) 500 MG tablet      The patient was counseled, risk factors were discussed, anticipatory guidance given.  Gross side effects, risk and benefits, and alternatives of medications discussed with patient.  Patient is aware that all medications have potential side effects and we are unable to predict every side effect or drug-drug interaction that may occur.  Expresses verbal understanding and consents to current therapy plan and  treatment regimen.  Return needs photo update, for f/up 69mo for mood- started on prozac, BP.  Please see AVS handed out to patient at the end of our visit for further patient instructions/ counseling done pertaining to today's office visit.    Note:  This document was prepared using Dragon voice recognition software and may include unintentional dictation errors.  This document serves as a record of services personally performed by Mellody Dance, DO. It was created on her behalf by Toni Amend, a trained medical scribe. The creation of this record is based on the scribe's personal observations and the provider's statements to them.   I have reviewed the above medical documentation for accuracy and completeness and I concur.  Mellody Dance, DO    Subjective:    HPI: Sheri Cole is a 47 y.o. female who presents to [place] [facility] today for follow up of [place].    Feels she's been good lately overall, however, is worried about her mood.  Mood - Depression Feels unmotivated to do anything lately.  When asked if she never feels like getting out of bed, she says "I don't have a choice.  I have to get out of bed, so therefore I do, but I don't want to.  I have no desire.  I'm not happy.  I'm not happy with myself, I don't know how to make myself happy."  Notes her main issue is "I can't motivate myself."  In terms of sleep, states "some nights are good and some nights are bad."  States it's "50/50."  "The issue with sleeping is, if I fall asleep on the couch and then I go to bed."  Notes she sometimes falls asleep from 9 PM to 12 AM, and then cannot sleep soundly afterward.  "I recognize that this is a problem and I need to just go to bed."  Remarks that she stopped all caffeine.  She had heart palpitations due to caffeine in the past.  "I do have an occasional one, I try not to do that much."  In the past, was managed on something through  her OBGYN.  Notes that it was Wellbutrin, and she's been off of it now for 8 or 9 months, "and maybe it was helping me more than I thought it was."  Was managed on Wellbutrin for 2-3 years.  "I am very chill about things generally, so I think it's depression."  Her daughter is managed on Fluoxetine for anxiety.  Notes "she's much more chill."  HTN:  -  Her blood pressure has been controlled at home.  Pt has not been checking it regularly, but every time she's had it checked, it's been perfect.  Notes "the half a pill is working."  She's been taking this for about six months.  Feels her blood pressure has been fine.  - Patient reports good compliance with blood pressure medications  - Denies medication S-E   - Smoking Status noted   - She denies new onset of: chest pain, exercise intolerance, shortness of breath, dizziness, visual changes, headache, lower extremity swelling or claudication.   Last 3 blood pressure readings in our office are as  follows: BP Readings from Last 3 Encounters:  08/17/18 121/79  08/04/18 127/80  04/16/18 122/83    Pulse Readings from Last 3 Encounters:  08/17/18 79  08/04/18 61  04/16/18 (!) 59    Filed Weights   08/17/18 0844  Weight: 225 lb 12.8 oz (102.4 kg)    Depression screen [facility] 2/9 08/17/2018 08/04/2018 04/16/2018  Decreased Interest 2 1 0  Down, Depressed, Hopeless 2 2 0  PHQ - 2 Score 4 3 0  Altered sleeping 0 0 0  Tired, decreased energy 2 1 1   Change in appetite 0 1 0  Feeling bad or failure about yourself  2 1 0  Trouble concentrating 0 0 0  Moving slowly or fidgety/restless 0 0 0  Suicidal thoughts 0 0 0  PHQ-9 Score 8 6 1   Difficult doing work/chores Somewhat difficult Not difficult at all -   No flowsheet data found.    Patient Care Team    Relationship Specialty Notifications Start End  Mellody Dance, DO PCP - General Family Medicine  02/05/2018   Obgyn, Erling Conte Attending Physician Gynecology  05-Feb-2018    Comment:  Patient sees Dr. Valentino Saxon at [facility] for her routine GYN care  Earnie Larsson, MD Consulting Physician Neurosurgery  Feb 05, 2018    Comment: Sees patient for discogenic lumbar disease and radiculopathy  Lavonna Monarch, MD Consulting Physician Dermatology  02/05/2018   Aloha Gell, MD Consulting Physician Obstetrics and Gynecology  02/05/2018      Lab Results  Component Value Date   CREATININE 0.81 04/16/2018   BUN 15 04/16/2018   NA 141 04/16/2018   K 4.4 04/16/2018   CL 101 04/16/2018   CO2 24 04/16/2018    Lab Results  Component Value Date   CHOL 155 03/02/2018   CHOL 212 (A) 04/30/2017    Lab Results  Component Value Date   HDL 47 03/02/2018   HDL 59 04/30/2017    Lab Results  Component Value Date   LDLCALC 90 03/02/2018   [place] 139 04/30/2017    Lab Results  Component Value Date   TRIG 90 03/02/2018   TRIG 70 04/30/2017    Lab Results  Component Value Date   CHOLHDL 3.3 03/02/2018    No results found for: LDLDIRECT ===================================================================   Patient Active Problem List   Diagnosis Date Noted  . Essential hypertension 03/05/2018    Priority: High  . History of hypertension-   did diet and lifestyle modification to better control in the past 2018/02/05    Priority: High  . History of hyperlipidemia Feb 05, 2018    Priority: High  . Obesity, Class I, BMI 30-34.9 02/05/18    Priority: Medium  . History of vitamin D deficiency 02-05-2018    Priority: Medium  . History of episode of depression- on wellbutrin in past due to stress of ex-husband 2018/02/05    Priority: Medium  . Family history of diabetes mellitus in father- in 109's; [place] 02/05/2018    Priority: Low  . Family history of lung cancer-father died age 44 history of heavy smoking Feb 05, 2018    Priority: Low  . Widowed-  husband died Suicide 5 yrs ago Feb 05, 2018    Priority: Low  . Family history of breast cancer in female 2018/02/05     Priority: Low  . Chronic left-sided low back pain with left-sided sciatica Feb 05, 2018    Priority: Low  . Lumbar discogenic pain syndrome 2018/02/05    Priority: Low  . Stress in life due to  family/ household 03/05/2018  . Elevated blood pressure reading 01/26/2018  . Facet arthropathy, lumbosacral 01/26/2018  . Elevated blood pressure 07/13/2015  . Herpes zoster 07/13/2015     Past Medical History:  Diagnosis Date  . Endometrial polyp 2016  . Vaginal delivery 2005, 2008, 2010     Past Surgical History:  Procedure Laterality Date  . DILATATION & CURETTAGE/HYSTEROSCOPY WITH MYOSURE N/A 10/14/2014   Procedure: DILATATION & CURETTAGE/HYSTEROSCOPY WITH MYOSURE;  Surgeon: Floyce Stakes. Pamala Hurry, MD;  Location: [facility];  Service: Gynecology;  Laterality: N/A;  . NO PAST SURGERIES       Family History  Problem Relation Age of Onset  . Cancer Father        metastatic lung (smoker)  . Diabetes Father   . Cancer Maternal Grandmother        breast, leukemia  . Cancer Paternal Grandmother        ovarian or cervical  . Hypertension Neg Hx   . CAD Neg Hx   . Stroke Neg Hx      Social History   Substance and Sexual Activity  Drug Use No  ,  Social History   Substance and Sexual Activity  Alcohol Use Yes  . Alcohol/week: 0.0 standard drinks   Comment: occasionally  ,  Social History   Tobacco Use  Smoking Status Never Smoker  Smokeless Tobacco Never Used  ,    Current Outpatient Medications on File Prior to Visit  Medication Sig Dispense Refill  . amLODIPine-Valsartan-HCTZ 5-160-12.5 MG TABS Take 1 tablet by mouth daily. 90 tablet 1  . Calcium Carb-Cholecalciferol (CALCIUM 500+D) 500-200 MG-UNIT TABS Take 1 tablet by mouth daily.    . vitamin B-12 (CYANOCOBALAMIN) 1000 MCG tablet Take 1,000 mcg by mouth daily.     No current facility-administered medications on file prior to visit.      Allergies  Allergen Reactions  . Sulfonamide Derivatives Hives     Review of  Systems:   General:  Denies fever, chills Optho/Auditory:   Denies visual changes, blurred vision Respiratory:   Denies SOB, cough, wheeze, DIB  Cardiovascular:   Denies chest pain, palpitations, painful respirations Gastrointestinal:   Denies nausea, vomiting, diarrhea.  Endocrine:     Denies new hot or cold intolerance Musculoskeletal:  Denies joint swelling, gait issues, or new unexplained myalgias/ arthralgias Skin:  Denies rash, suspicious lesions  Neurological:    Denies dizziness, unexplained weakness, numbness  Psychiatric/Behavioral:   Denies mood changes  Objective:    Blood pressure 121/79, pulse 79, temperature 98.1 F (36.7 C), height 5\' 8"  (1.727 m), weight 225 lb 12.8 oz (102.4 kg), SpO2 99 %.  Body mass index is 34.33 kg/m.  General: Well Developed, well nourished, and in no acute distress.  HEENT: Normocephalic, atraumatic, pupils equal round reactive to light, neck supple, No carotid bruits, no JVD Skin: Warm and dry, cap RF less 2 sec Cardiac: Regular rate and rhythm, S1, S2 WNL's, no murmurs rubs or gallops Respiratory: ECTA B/L, Not using accessory muscles, speaking in full sentences. NeuroM-Sk: Ambulates w/o assistance, moves ext * 4 w/o difficulty, sensation grossly intact.  Ext: scant edema b/l lower ext Psych: No HI/SI, judgement and insight good, Euthymic mood. Full Affect.

## 2018-11-05 ENCOUNTER — Other Ambulatory Visit: Payer: Self-pay | Admitting: Family Medicine

## 2018-11-05 DIAGNOSIS — Z6379 Other stressful life events affecting family and household: Secondary | ICD-10-CM

## 2018-11-05 DIAGNOSIS — F341 Dysthymic disorder: Secondary | ICD-10-CM

## 2018-11-06 MED ORDER — FLUOXETINE HCL 40 MG PO CAPS: 40.0000 mg | ORAL_CAPSULE | Freq: Every day | ORAL | 0 refills | Status: DC

## 2018-11-06 NOTE — Telephone Encounter (Signed)
Please see note from pt re: request to increase dosage.  Charyl Bigger, CMA

## 2018-11-06 NOTE — Telephone Encounter (Signed)
Please tell pt that we need OV to document how she is doing on treatment plan prior to any further RF's.

## 2018-11-29 ENCOUNTER — Other Ambulatory Visit: Payer: Self-pay | Admitting: Family Medicine

## 2018-11-29 DIAGNOSIS — F341 Dysthymic disorder: Secondary | ICD-10-CM

## 2018-11-29 DIAGNOSIS — Z6379 Other stressful life events affecting family and household: Secondary | ICD-10-CM

## 2019-02-24 ENCOUNTER — Other Ambulatory Visit: Payer: Self-pay | Admitting: Family Medicine

## 2019-02-24 DIAGNOSIS — Z6379 Other stressful life events affecting family and household: Secondary | ICD-10-CM

## 2019-02-24 DIAGNOSIS — F341 Dysthymic disorder: Secondary | ICD-10-CM

## 2019-03-17 ENCOUNTER — Other Ambulatory Visit: Payer: Self-pay

## 2019-03-17 ENCOUNTER — Encounter: Payer: Self-pay | Admitting: Family Medicine

## 2019-03-17 ENCOUNTER — Ambulatory Visit (INDEPENDENT_AMBULATORY_CARE_PROVIDER_SITE_OTHER): Payer: BC Managed Care – PPO | Admitting: Family Medicine

## 2019-03-17 VITALS — Wt 216.0 lb

## 2019-03-17 DIAGNOSIS — Z6379 Other stressful life events affecting family and household: Secondary | ICD-10-CM

## 2019-03-17 DIAGNOSIS — F341 Dysthymic disorder: Secondary | ICD-10-CM

## 2019-03-17 DIAGNOSIS — I10 Essential (primary) hypertension: Secondary | ICD-10-CM

## 2019-03-17 DIAGNOSIS — E669 Obesity, unspecified: Secondary | ICD-10-CM | POA: Diagnosis not present

## 2019-03-17 MED ORDER — AMLODIPINE-VALSARTAN-HCTZ 5-160-12.5 MG PO TABS: 0.5000 | ORAL_TABLET | Freq: Every day | ORAL | 1 refills | Status: DC

## 2019-03-17 MED ORDER — FLUOXETINE HCL 40 MG PO CAPS: 40.0000 mg | ORAL_CAPSULE | Freq: Every day | ORAL | 1 refills | Status: DC

## 2019-03-17 NOTE — Progress Notes (Signed)
Telehealth office visit note for Sheri Cole, D.O- at [facility] at [facility]   I connected with current patient today and verified that I am speaking with the correct person using two identifiers.   . Location of the patient: Home . Location of the provider: Office Only the patient (+/- their family members at pt's discretion) and myself were participating in the encounter    - This visit type was conducted due to national recommendations for restrictions regarding the COVID-19 Pandemic (e.g. social distancing) in an effort to limit this patient's exposure and mitigate transmission in our community.  This format is felt to be most appropriate for this patient at this time.   - The patient did not have access to video technology or had technical difficulties with video requiring transitioning to audio format only. - No physical exam could be performed with this format, beyond that communicated to [place] by the patient/ family members as noted.   - Additionally my office staff/ schedulers discussed with the patient that there may be a monetary charge related to this service, depending on their medical insurance.   The patient expressed understanding, and agreed to proceed.       History of Present Illness:  Started Prozac back in 12/19 - she loves it.  Pt was told to f/up in 4 mo - never did.  She lost her job in April after 81 + yrs.   Severance package from work.    Feels like 40mg  is a good dose. - sleeping ok- pretty good.    Broke up with BF recently.  - walking more --> 200+ min/wk.  Thinking about herself more.     HTN:  No checking at home although patient says she feels normal.  Whenever she recently went to a dental appointment she said it was in the low 130s over upper 70s -She is taking her medicines as prescribed.  Impression and Recommendations:    1. Essential hypertension   2. Stress in life due to family/ household   3. Dysthymia   4. Obesity, Class I, BMI  30-34.9     -Blood pressure-stable.  Told patient needs to come in for fasting blood work future.  -Stress/dysthymia- stable on current dose of meds.  No need for adjustment at this time.  Patient told to use over-the-counter melatonin if sleep becomes a problem.  -Weight- BMI counseling done.  Encouraged to continue with healthier habits and weight loss.  -Needs yearly physical near future.  Has never had one.  Apparently due to patient losing her job recently, her health insurance ends July 24.  She would like to get in for a yearly physical before then.  I told her she can call and ask but I could not guarantee that she can get in before her medical insurance runs out  - As part of my medical decision making, I reviewed the following data within the [place] History obtained from pt /family, CMA notes reviewed and incorporated if applicable, Labs reviewed, Radiograph/ tests reviewed if applicable and OV notes from prior OV's with me, as well as other specialists she/he has seen since seeing me last, were all reviewed and used in my medical decision making process today.   - Additionally, discussion had with patient regarding txmnt plan, and their biases/concerns about that plan were used in my medical decision making today.   - The patient agreed with the plan and demonstrated an understanding  of the instructions.   No barriers to understanding were identified.   - Red flag symptoms and signs discussed in detail.  Patient expressed understanding regarding what to do in case of emergency\ urgent symptoms.  The patient was advised to call back or seek an in-person evaluation if the symptoms worsen or if the condition fails to improve as anticipated.   Return for Patient will follow-up for yearly physical in future-needs fasting blood work.     Meds ordered this encounter  Medications  . amLODIPine-Valsartan-HCTZ 5-160-12.5 MG TABS    Sig: Take 0.5 tablets by mouth daily.     Dispense:  45 tablet    Refill:  1  . FLUoxetine (PROZAC) 40 MG capsule    Sig: Take 1 capsule (40 mg total) by mouth daily.    Dispense:  90 capsule    Refill:  1    Medications Discontinued During This Encounter  Medication Reason  . amLODIPine-Valsartan-HCTZ 5-160-12.5 MG TABS Reorder  . FLUoxetine (PROZAC) 40 MG capsule Reorder     I provided 15 minutes of non-face-to-face time during this encounter,with over 50% of the time in direct counseling on patients medical conditions/ medical concerns.  Additional time was spent with charting and coordination of care after the actual visit commenced.   Note:  This note was prepared with assistance of Dragon voice recognition software. Occasional wrong-word or sound-a-like substitutions may have occurred due to the inherent limitations of voice recognition software.  Sheri Dance, DO 03/17/19 11:00 AM    Patient Care Team    Relationship Specialty Notifications Start End  Sheri Dance, DO PCP - General Family Medicine  2018/02/15   Obgyn, Erling Conte Attending Physician Gynecology  02/15/18    Comment: Patient sees Dr. Valentino Saxon at [place] for her routine GYN care  Earnie Larsson, [place] Physician Neurosurgery  02/15/2018    Comment: Sees patient for discogenic lumbar disease and radiculopathy  Lavonna Monarch, MD Consulting Physician Dermatology  2018/02/15   Aloha Gell, MD Consulting Physician Obstetrics and Gynecology  15-Feb-2018      -Vitals obtained; medications/ allergies reconciled;  personal medical, social, Sx etc.histories were updated by CMA, reviewed by me and are reflected in chart   Patient Active Problem List   Diagnosis Date Noted  . Essential hypertension 03/05/2018    Priority: High  . History of hypertension-   did diet and lifestyle modification to better control in the past February 15, 2018    Priority: High  . History of hyperlipidemia 2018/02/15    Priority: High  . Obesity, Class I, BMI 30-34.9  02/15/2018    Priority: Medium  . History of vitamin D deficiency 02-15-2018    Priority: Medium  . History of episode of depression- on wellbutrin in past due to stress of ex-husband February 15, 2018    Priority: Medium  . Family history of diabetes mellitus in father- in 90's; [place] Feb 15, 2018    Priority: Low  . Family history of lung cancer-father died age 48 history of heavy smoking 15-Feb-2018    Priority: Low  . Widowed-  husband died Suicide 5 yrs ago 02/15/2018    Priority: Low  . Family history of breast cancer in female 2018-02-15    Priority: Low  . Chronic left-sided low back pain with left-sided sciatica 02-15-2018    Priority: Low  . Lumbar discogenic pain syndrome 02-15-18    Priority: Low  . Stress in life due to family/ household 03/05/2018  . Elevated blood pressure reading Feb 15, 2018  .  Facet arthropathy, lumbosacral 01/26/2018  . Elevated blood pressure 07/13/2015  . Herpes zoster 07/13/2015     Current Meds  Medication Sig  . amLODIPine-Valsartan-HCTZ 5-160-12.5 MG TABS Take 0.5 tablets by mouth daily.  . Calcium Carb-Cholecalciferol (CALCIUM 500+D) 500-200 MG-UNIT TABS Take 1 tablet by mouth daily.  Marland Kitchen FLUoxetine (PROZAC) 40 MG capsule Take 1 capsule (40 mg total) by mouth daily.  . vitamin B-12 (CYANOCOBALAMIN) 1000 MCG tablet Take 1,000 mcg by mouth daily.  . [DISCONTINUED] amLODIPine-Valsartan-HCTZ 5-160-12.5 MG TABS Take 1 tablet by mouth daily.  . [DISCONTINUED] FLUoxetine (PROZAC) 40 MG capsule Take 1 capsule (40 mg total) by mouth daily. Needs office visit for further refills     Allergies:  Allergies  Allergen Reactions  . Sulfonamide Derivatives Hives     ROS:  See above HPI for pertinent positives and negatives   Objective:   Weight 216 lb (98 kg), last menstrual period 03/03/2019.  (if some vitals are omitted, this means that patient was UNABLE to obtain them even though they were asked to get them prior to [place] today.  They were asked to  call us at their earliest convenience with these once obtained. )  General: A & O * 3; sounds in no acute distress; in usual state of health.  Skin: Pt confirms warm and dry extremities and pink fingertips HEENT: Pt confirms lips non-cyanotic Chest: Patient confirms normal chest excursion and movement Respiratory: speaking in full sentences, no conversational dyspnea; patient confirms no use of accessory muscles Psych: insight appears good, mood- appears full

## 2019-05-27 IMAGING — MG DIGITAL DIAGNOSTIC UNILATERAL LEFT MAMMOGRAM WITH TOMO AND CAD
4 series · 4 of 12 positions shown · non-contrast
Comparison: Previous exam(s).

CLINICAL DATA: Patient complains of palpable abnormalities in the
left breast.

EXAM:
DIGITAL DIAGNOSTIC LEFT MAMMOGRAM WITH CAD AND TOMO
ULTRASOUND LEFT BREAST

[L MLO synth-2D]
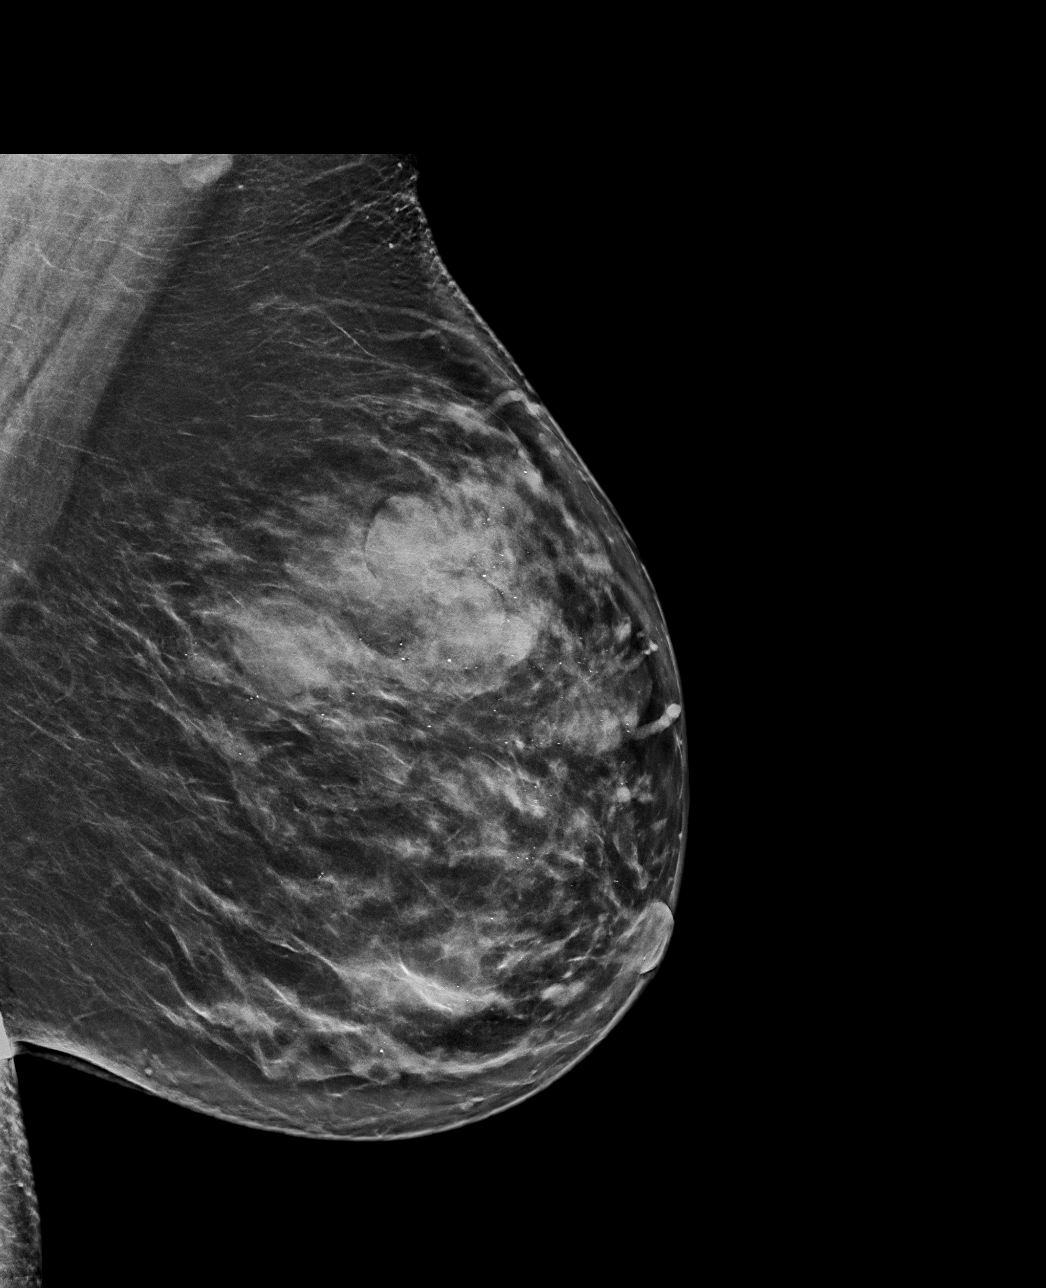

[L CC synth-2D]
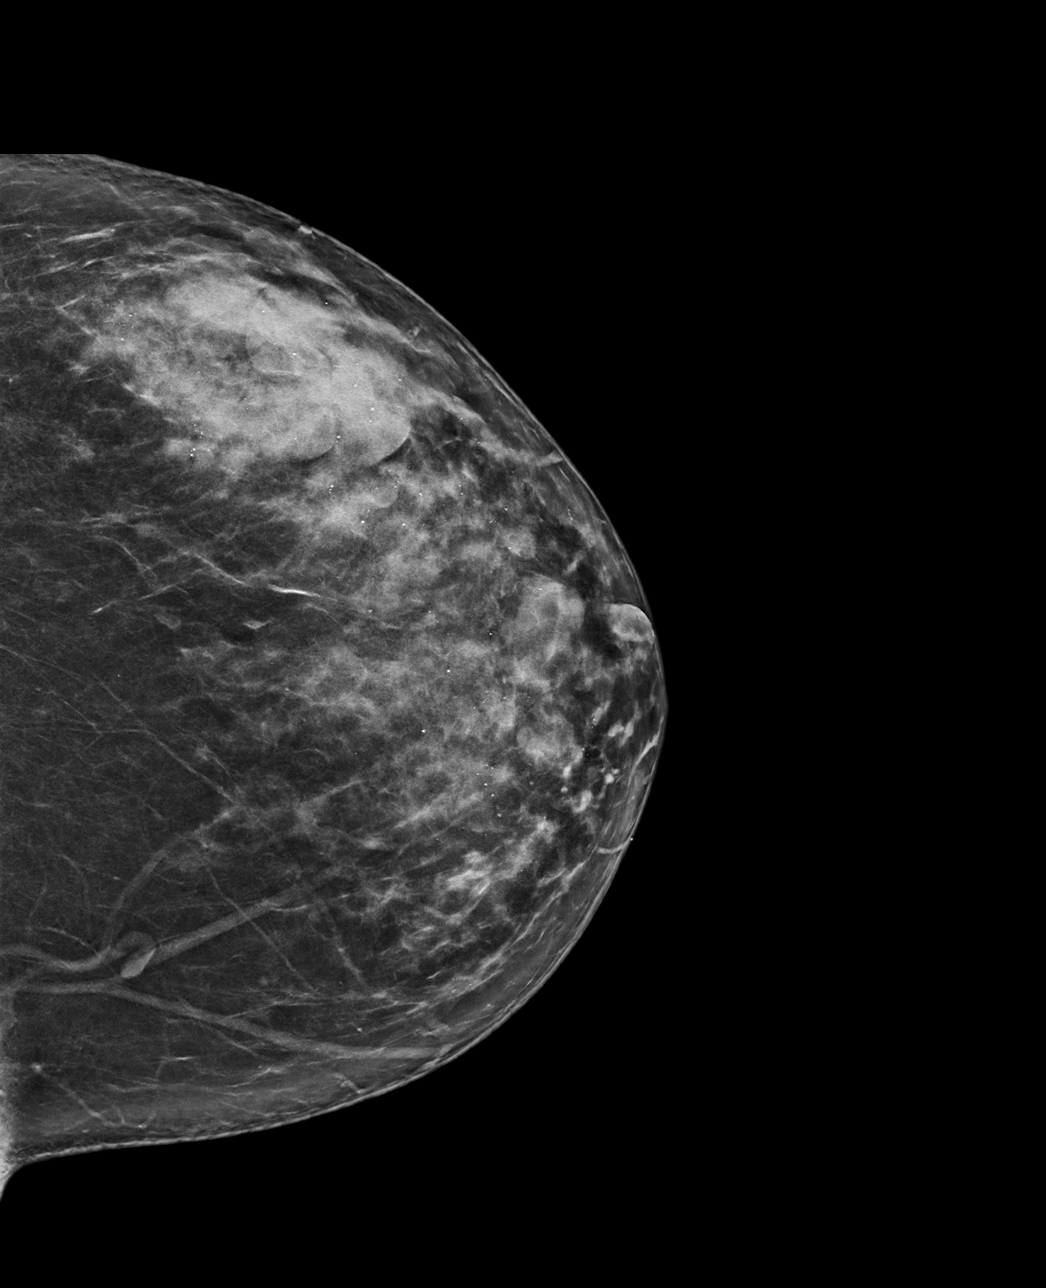

[L MLO tomo · tomo slice 46/91.0]
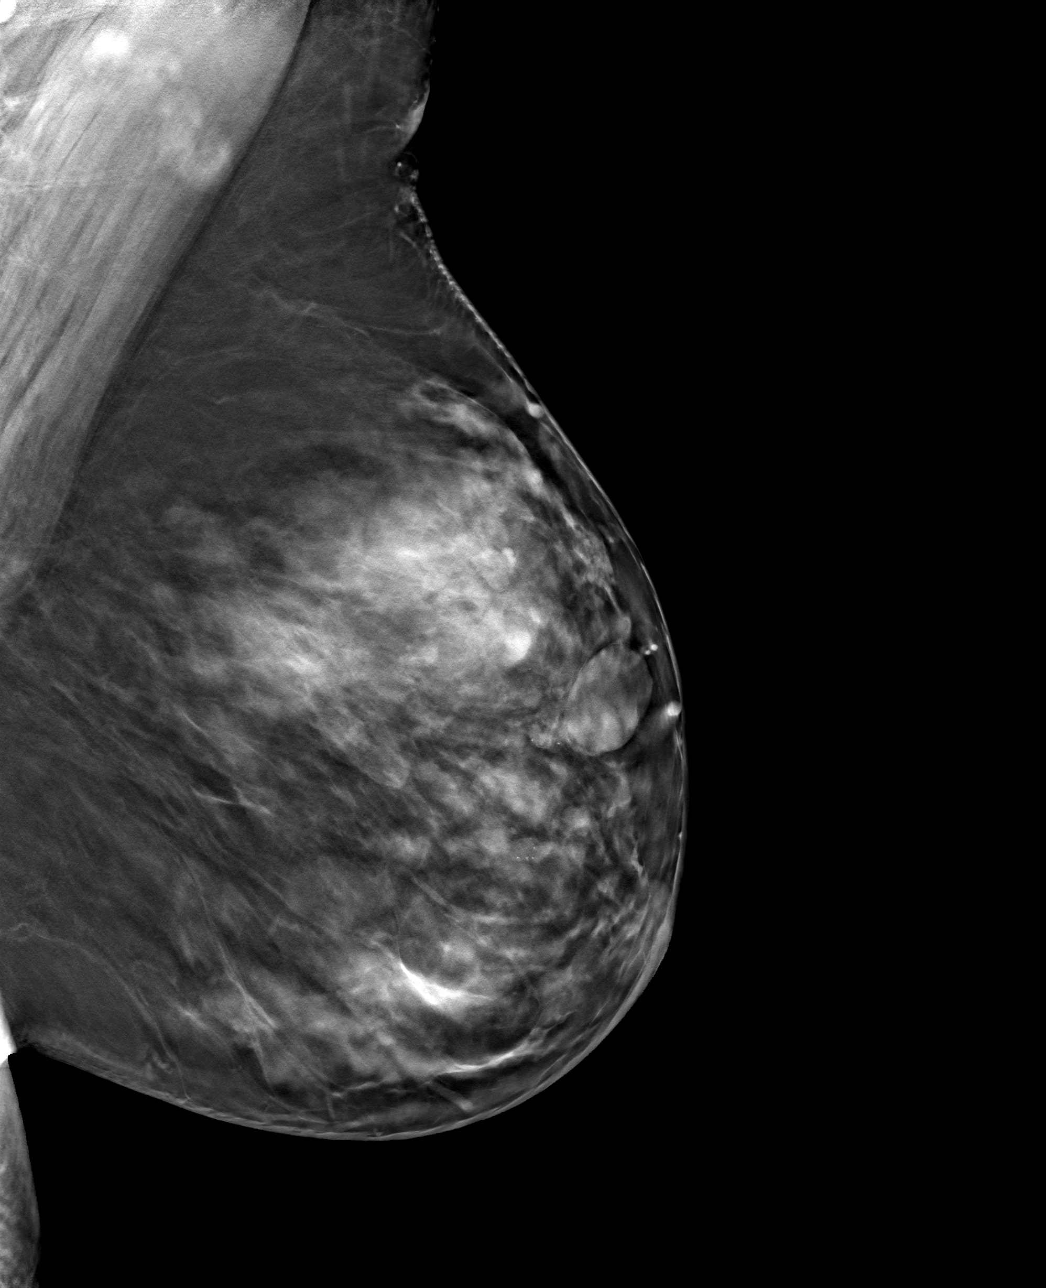

[L CC tomo · tomo slice 41/82.0]
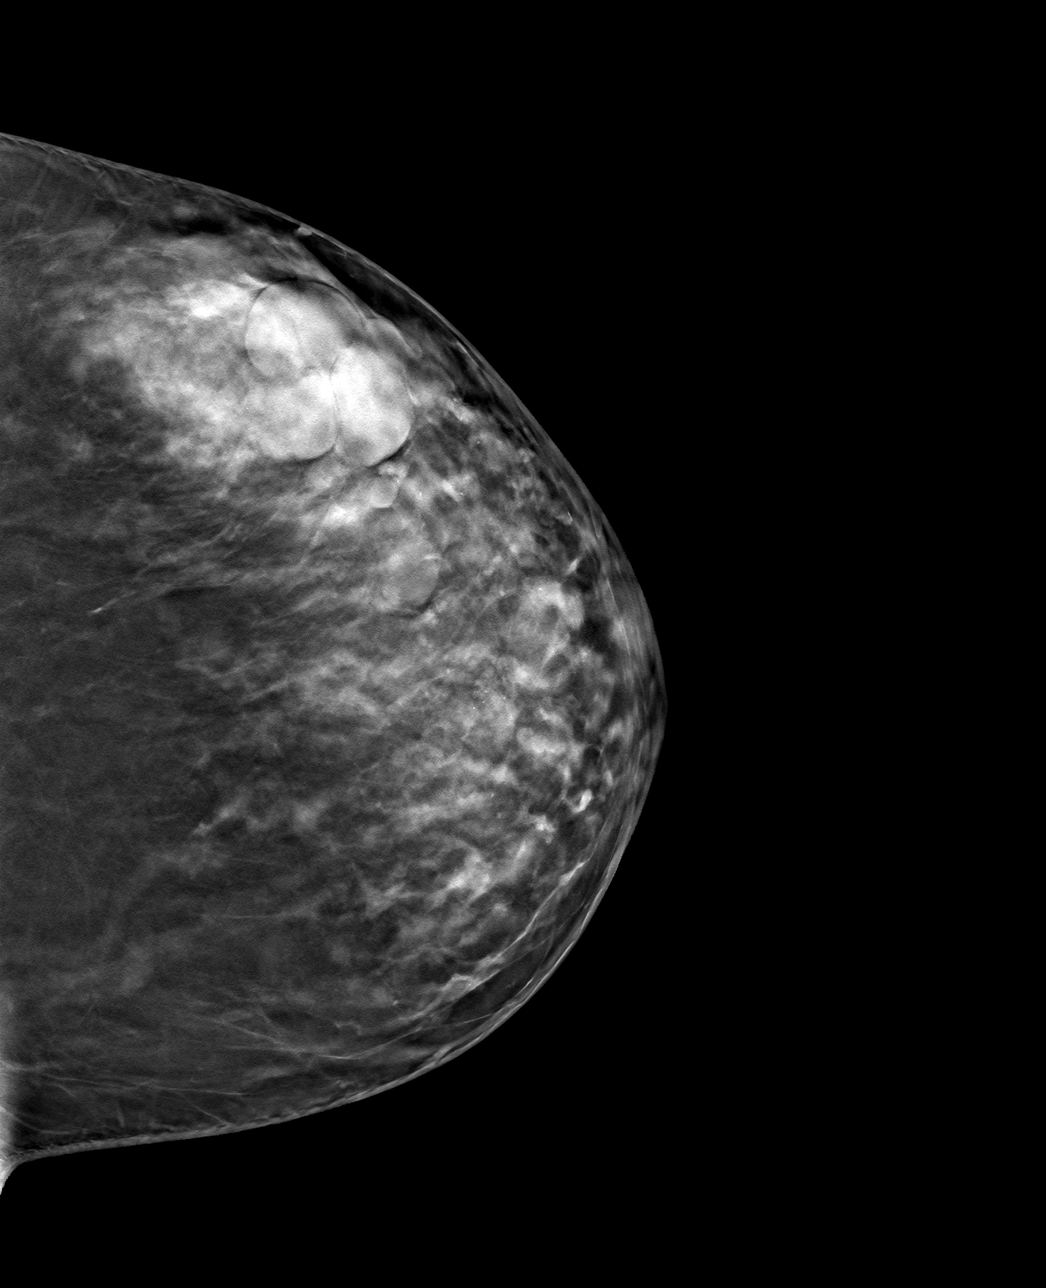

[4 of 12 positions shown; findings below may reference images not displayed]

ACR Breast Density Category c: The breast tissue is heterogeneously
dense, which may obscure small masses.
FINDINGS: Circumscribed waxing and waning masses are seen in the left breast
consistent with cysts. No suspicious mass or malignant type
microcalcifications identified.

Mammographic images were processed with CAD.

On physical exam, 2 masses are palpated in the left breast at 2
o'clock 8 cm from the nipple and [DATE] 10 cm from the nipple.

Targeted ultrasound is performed, showing a well-circumscribed
anechoic cyst in the left breast at 2 o'clock 8 cm from the nipple
measuring 2.5 x 1.3 x 2.4 cm. There is an anechoic cyst in the left
breast at [DATE] 10 cm from the nipple measuring 1.7 x 1.8 x 2.2 cm.
IMPRESSION: Left breast cysts.  No evidence of malignancy in the left breast.

RECOMMENDATION:
Bilateral screening mammogram in Thursday July, 2019 is recommended.

I have discussed the findings and recommendations with the patient.
Results were also provided in writing at the conclusion of the
visit. If applicable, a reminder letter will be sent to the patient
regarding the next appointment.

BI-RADS CATEGORY  2: Benign.

## 2019-09-14 LAB — HM PAP SMEAR: HM Pap smear: POSITIVE

## 2019-09-18 ENCOUNTER — Other Ambulatory Visit: Payer: Self-pay | Admitting: Family Medicine

## 2019-09-18 DIAGNOSIS — F341 Dysthymic disorder: Secondary | ICD-10-CM

## 2019-09-18 DIAGNOSIS — I10 Essential (primary) hypertension: Secondary | ICD-10-CM

## 2019-09-18 DIAGNOSIS — Z6379 Other stressful life events affecting family and household: Secondary | ICD-10-CM

## 2019-10-13 ENCOUNTER — Telehealth: Payer: Self-pay | Admitting: Family Medicine

## 2019-10-13 NOTE — Telephone Encounter (Signed)
Patient called states pharmacy will not fill :  (says her Ins Co will only cover 90dys scripts)--- advised her that per Rx script an Appt is required for any futher refills.  FLUoxetine (PROZAC) 40 MG capsule [id number]   Order Details Dose: 40 mg Route: Oral Frequency: Daily  Dispense Quantity: 30 capsule Refills: 0       Sig: Take 1 capsule (40 mg total) by mouth daily. **PATIENT NEEDS APT FOR FURTHER REFILLS**       --Forwarding message that pt scheduled appt / DOXY for Tues Feb 9@ 10am w/ provider& wishes Rx refills approved to:   [id number] [address], [place] [phone] (Phone) [phone] (Fax)   Pls contact pt if any questions @ [phone].  -glh

## 2019-10-13 NOTE — Telephone Encounter (Signed)
Spoke with Patient and advised that we could not send in 90 day supply until patient is seen in office. Her last office visit was 7/20 and patient was advised then to follow up in near future for physical. Advised patient of our prescription refill policy and she verbalized understanding. Call was transferred to front desk so she could give her new insurance information. AS, CMA

## 2019-10-19 ENCOUNTER — Other Ambulatory Visit: Payer: Self-pay

## 2019-10-19 ENCOUNTER — Ambulatory Visit (INDEPENDENT_AMBULATORY_CARE_PROVIDER_SITE_OTHER): Payer: PRIVATE HEALTH INSURANCE | Admitting: Family Medicine

## 2019-10-19 ENCOUNTER — Encounter: Payer: Self-pay | Admitting: Family Medicine

## 2019-10-19 VITALS — Ht 67.0 in | Wt 223.0 lb

## 2019-10-19 DIAGNOSIS — F341 Dysthymic disorder: Secondary | ICD-10-CM

## 2019-10-19 DIAGNOSIS — I10 Essential (primary) hypertension: Secondary | ICD-10-CM | POA: Diagnosis not present

## 2019-10-19 DIAGNOSIS — E669 Obesity, unspecified: Secondary | ICD-10-CM | POA: Diagnosis not present

## 2019-10-19 DIAGNOSIS — Z6379 Other stressful life events affecting family and household: Secondary | ICD-10-CM

## 2019-10-19 DIAGNOSIS — Z8639 Personal history of other endocrine, nutritional and metabolic disease: Secondary | ICD-10-CM

## 2019-10-19 MED ORDER — FLUOXETINE HCL 40 MG PO CAPS: 40.0000 mg | ORAL_CAPSULE | Freq: Every day | ORAL | 0 refills | Status: DC

## 2019-10-19 MED ORDER — AMLODIPINE-VALSARTAN-HCTZ 5-160-12.5 MG PO TABS: 1.0000 | ORAL_TABLET | Freq: Every day | ORAL | 0 refills | Status: DC

## 2019-10-19 NOTE — Progress Notes (Signed)
Virtual office visit note for [employer], D.O- Primary Care Physician at [facility]   I connected with current patient today and beyond visually recognizing the correct individual, I verified that I am speaking with the correct person using two identifiers.  . Location of the patient: Home . Location of the provider: Office Only the patient (+/- their family members at pt's discretion) and myself were participating in the encounter   - This visit type was conducted due to national recommendations for restrictions regarding the COVID-19 Pandemic (e.g. social distancing) in an effort to limit this patient's exposure and mitigate transmission in our community.  This format is felt to be most appropriate for this patient at this time.   - No physical exam could be performed with this format, beyond that communicated to [place] by the patient/ family members as noted.   - Additionally my office staff/ schedulers discussed with the patient that there may be a monetary charge related to this service, depending on patient's medical insurance.   The patient expressed understanding, and agreed to proceed.      History of Present Illness:   I, Toni Amend, am serving as Education administrator for [employer].  Patient notes she's been doing okay since her last OV.  - Mood Management Her mood on the Prozac has been good.  Confirms that she continues 40 mg.  She has not been exercising as often lately.  Notes she knows she needs to resume, because she noticed her weight was up in the scale more than she likes to see.  "With all the rain, I got out of the habit."  Feels she's usually been around 216-218 lbs, and her weight today is 223, which is more than she's ever weighed aside from being pregnant.  States she's been working around [employer], dealing with her kids, and notes she's still unemployed.  She lost her job of nearly 20 years last spring  Her kids are at home doing online learning.  Notes  her 48 year old is "even more depressed than she's ever been."  Says that January was a hard month, but is dealing with her stressors and now has a more supportive boyfriend.  - Sleep Thinks she sleeps roughly eight hours per night.  She has tried melatonin in the past but did not feel it worked for her.  - Supplementation She continues Vitamin D and Vitamin B12.  Was placed on folic acid by gynecologist.  Was told that she tested positive for HPV and that folic acid helps with this.  HPI:  Hypertension:  -  Her blood pressure at home has been running: She has not been checking her BP often recently, and checked her BP today and notes it was 146/93.  Notes that when she took her BP meds today she went ahead and took a whole pill.  Notes she has enough extra meds that she can take a whole pill for a couple of weeks without impacting her supply.  - Patient reports good compliance with medication and/or lifestyle modification  - Her denies acute concerns or problems related to treatment plan  - She denies new onset of: chest pain, exercise intolerance, shortness of breath, dizziness, visual changes, headache, lower extremity swelling or claudication.   Last 3 blood pressure readings in our office are as follows: BP Readings from Last 3 Encounters:  08/17/18 121/79  08/04/18 127/80  04/16/18 122/83   Filed Weights   10/19/19 0920  Weight: 223 lb (101.2 kg)   HPI:  Hyperlipidemia:  49 y.o. female here for cholesterol follow-up.   - Patient reports good compliance with treatment plan of:  medication and/ or lifestyle management.    - Patient denies any acute concerns or problems with management plan   - She denies new onset of: myalgias, arthralgias, increased fatigue more than normal, chest pains, exercise intolerance, shortness of breath, dizziness, visual changes, headache, lower extremity swelling or claudication.   Most recent cholesterol panel was:  Lab Results  Component  Value Date   CHOL 155 03/02/2018   HDL 47 03/02/2018   LDLCALC 90 03/02/2018   TRIG 90 03/02/2018   CHOLHDL 3.3 03/02/2018   Hepatic Function Latest Ref Rng & Units 04/16/2018 03/02/2018 04/30/2017  Total Protein 6.0 - 8.5 g/dL 6.8 6.0 -  Albumin 3.5 - 5.5 g/dL 4.2 4.0 -  AST 0 - 40 IU/L '15 13 16  ' ALT 0 - 32 IU/L '14 15 14  ' Alk Phosphatase 39 - 117 IU/L 31(L) 31(L) 34  Total Bilirubin 0.0 - 1.2 mg/dL 0.4 0.3 -        Depression screen [facility] 2/9 10/19/2019 03/17/2019 08/17/2018 08/04/2018 04/16/2018  Decreased Interest 0 0 2 1 0  Down, Depressed, Hopeless 0 0 2 2 0  PHQ - 2 Score 0 0 4 3 0  Altered sleeping 0 0 0 0 0  Tired, decreased energy 1 0 '2 1 1  ' Change in appetite 0 0 0 1 0  Feeling bad or failure about yourself  0 0 2 1 0  Trouble concentrating 0 0 0 0 0  Moving slowly or fidgety/restless 0 0 0 0 0  Suicidal thoughts 0 0 0 0 0  PHQ-9 Score 1 0 '8 6 1  ' Difficult doing work/chores Not difficult at all - Somewhat difficult Not difficult at all -    GAD 7 : Generalized Anxiety Score 03/17/2019  Nervous, Anxious, on Edge 0  Control/stop worrying 0  Worry too much - different things 0  Trouble relaxing 0  Restless 0  Easily annoyed or irritable 1  Afraid - awful might happen 0  Total GAD 7 Score 1     Impression and Recommendations:    1. Essential hypertension   2. History of hyperlipidemia   3. Obesity, Class I, BMI 30-34.9   4. History of vitamin D deficiency   5. Stress in life due to family/ household   6. Dysthymia     - Last seen 03/17/2019  Stress in Life due to family/household, Dysthymia - Stable at this time on current management. - Continue Prozac as prescribed.  See med list.  - In addition to prescription intervention, reviewed the "spokes of the wheel" of mood and health management.  Stressed the importance of ongoing prudent habits, including regular exercise, appropriate sleep hygiene, healthful dietary habits, and prayer/meditation to relax.  - Will  continue to monitor.  History of Vitamin D Deficiency - Continue Vitamin D supplementation as prescribed. - Will continue to monitor and re-check as discussed.  Essential Hypertension - BP elevated today. - Advised patient to aim for a goal BP of 130/80 or less.  - Increase dose of BP medication to one full tablet daily. - Refill provided at one full tablet daily.  See med list. - Patient will continue treatment regimen as discussed.  - Counseled patient on pathophysiology of disease and discussed various treatment options, which always includes dietary and lifestyle modification as first line.   -  Lifestyle changes such as dash and heart healthy diets and engaging in a regular exercise program discussed extensively with patient.   - Ambulatory blood pressure monitoring encouraged at least 3 times weekly.  Keep log and bring in every office visit.  Reminded patient that if they ever feel poorly in any way, to check their blood pressure and pulse.  - We will continue to monitor closely.  BMI Counseling - Body mass index is 34.93 kg/m Explained to patient what BMI refers to, and what it means medically.    Told patient to think about it as a "medical risk stratification measurement" and how increasing BMI is associated with increasing risk/ or worsening state of various diseases such as hypertension, hyperlipidemia, diabetes, premature OA, depression etc.  American Heart Association guidelines for healthy diet, basically Mediterranean diet, and exercise guidelines of 30 minutes 5 days per week or more discussed in detail.  Health counseling performed.  All questions answered.  Lifestyle & Preventative Health Maintenance - Advised patient to continue working toward exercising to improve overall mental, physical, and emotional health.    - Encouraged patient to engage in daily physical activity, especially a formal exercise routine.  Recommended that the patient eventually strive for at  least 150 minutes of moderate cardiovascular activity per week according to guidelines established by the [facility].   - Healthy dietary habits encouraged, including low-carb, and high amounts of lean protein in diet.   - Patient should also consume adequate amounts of water.  Recommendations - Return near future for CPE and full fasting blood work.  - As part of my medical decision making, I reviewed the following data within the [place] History obtained from pt /family, CMA notes reviewed and incorporated if applicable, Labs reviewed, Radiograph/ tests reviewed if applicable and OV notes from prior OV's with me, as well as other specialists she/he has seen since seeing me last, were all reviewed and used in my medical decision making process today.   - Additionally, discussion had with patient regarding txmnt plan, their biases about that plan etc were used in my medical decision making today.   - The patient agreed with the plan and demonstrated an understanding of the instructions.   No barriers to understanding were identified.     Return for f/up near future for CPE and full fasting lab work same day.    Meds ordered this encounter  Medications  . DISCONTD: amLODIPine-Valsartan-HCTZ 5-160-12.5 MG TABS    Sig: Take 1 tablet by mouth daily.    Dispense:  90 tablet    Refill:  0  . DISCONTD: FLUoxetine (PROZAC) 40 MG capsule    Sig: Take 1 capsule (40 mg total) by mouth daily. **PATIENT NEEDS APT FOR FURTHER REFILLS**    Dispense:  90 capsule    Refill:  0  . FLUoxetine (PROZAC) 40 MG capsule    Sig: Take 1 capsule (40 mg total) by mouth daily.    Dispense:  90 capsule    Refill:  0    Medications Discontinued During This Encounter  Medication Reason  . amLODIPine-Valsartan-HCTZ 5-160-12.5 MG TABS Reorder  . FLUoxetine (PROZAC) 40 MG capsule Reorder  . FLUoxetine (PROZAC) 40 MG capsule      Note:  This note was prepared with assistance of Dragon voice  recognition software. Occasional wrong-word or sound-a-like substitutions may have occurred due to the inherent limitations of voice recognition software.  This document serves as a record of services personally performed  by Mellody Dance, DO. It was created on her behalf by Toni Amend, a trained medical scribe. The creation of this record is based on the scribe's personal observations and the provider's statements to them.   This case required medical decision making of at least moderate complexity.  This case required medical decision making of at least moderate complexity. The above documentation from Toni Amend, medical scribe, has been reviewed by Marjory Sneddon, D.O.    Patient Care Team    Relationship Specialty Notifications Start End  Mellody Dance, DO PCP - General Family Medicine  01/27/18   Obgyn, Erling Conte Attending Physician Gynecology  2018-01-27    Comment: Patient sees Dr. Valentino Saxon at [facility] for her routine GYN care  Earnie Larsson, [place] Physician Neurosurgery  01-27-2018    Comment: Sees patient for discogenic lumbar disease and radiculopathy  Lavonna Monarch, MD Consulting Physician Dermatology  2018/01/27   Aloha Gell, MD Consulting Physician Obstetrics and Gynecology  01-27-2018     -Vitals obtained; medications/ allergies reconciled;  personal medical, social, Sx etc.histories were updated by CMA, reviewed by me and are reflected in chart  Patient Active Problem List   Diagnosis Date Noted  . Essential hypertension 03/05/2018  . History of hypertension-   did diet and lifestyle modification to better control in the past 01/27/18  . History of hyperlipidemia 01-27-18  . Obesity, Class I, BMI 30-34.9 01-27-18  . History of vitamin D deficiency 2018-01-27  . History of episode of depression- on wellbutrin in past due to stress of ex-husband 2018/01/27  . Family history of diabetes mellitus in father- in 64's; [place] 01-27-18  .  Family history of lung cancer-father died age 69 history of heavy smoking 01-27-2018  . Widowed-  husband died Suicide 5 yrs ago 01/27/2018  . Family history of breast cancer in female 01-27-18  . Chronic left-sided low back pain with left-sided sciatica 2018-01-27  . Lumbar discogenic pain syndrome 01/27/18  . Dysthymia 10/19/2019  . Stress in life due to family/ household 03/05/2018  . Elevated blood pressure reading Jan 27, 2018  . Facet arthropathy, lumbosacral 01/27/18  . Elevated blood pressure 07/13/2015  . Herpes zoster 07/13/2015     Current Meds  Medication Sig  . Calcium Carb-Cholecalciferol (CALCIUM 500+D) 500-200 MG-UNIT TABS Take 1 tablet by mouth daily.  Marland Kitchen FLUoxetine (PROZAC) 40 MG capsule Take 1 capsule (40 mg total) by mouth daily.  . folic acid (FOLVITE) 1 MG tablet Take 1 mg by mouth daily.  . vitamin B-12 (CYANOCOBALAMIN) 1000 MCG tablet Take 1,000 mcg by mouth daily.  . [DISCONTINUED] amLODIPine-Valsartan-HCTZ 5-160-12.5 MG TABS Take 0.5 tablets by mouth daily. **PATIENT NEEDS APT FOR FURTHER REFILLS**  . [DISCONTINUED] amLODIPine-Valsartan-HCTZ 5-160-12.5 MG TABS Take 1 tablet by mouth daily.  . [DISCONTINUED] FLUoxetine (PROZAC) 40 MG capsule Take 1 capsule (40 mg total) by mouth daily. **PATIENT NEEDS APT FOR FURTHER REFILLS**  . [DISCONTINUED] FLUoxetine (PROZAC) 40 MG capsule Take 1 capsule (40 mg total) by mouth daily. **PATIENT NEEDS APT FOR FURTHER REFILLS**    Allergies  Allergen Reactions  . Sulfonamide Derivatives Hives    ROS:  See above HPI for pertinent positives and negatives   Objective:   Height '5\' 7"'  (1.702 m), weight 223 lb (101.2 kg), last menstrual period 10/05/2019.  (if some vitals are omitted, this means that patient was UNABLE to obtain them even though they were asked to get them prior to [place] today.  They were asked to call us at their  earliest convenience with these once obtained.)  General: A & O * 3; visually in no acute  distress; in usual state of health.  Skin: Visible skin appears normal and pt's usual skin color HEENT:  EOMI, head is normocephalic and atraumatic.  Sclera are anicteric. Neck has a good range of motion.  Lips are noncyanotic Chest: normal chest excursion and movement Respiratory: speaking in full sentences, no conversational dyspnea; no use of accessory muscles Psych: insight good, mood- appears full

## 2019-10-21 ENCOUNTER — Other Ambulatory Visit: Payer: Self-pay | Admitting: Family Medicine

## 2019-10-21 DIAGNOSIS — I10 Essential (primary) hypertension: Secondary | ICD-10-CM

## 2020-01-13 ENCOUNTER — Other Ambulatory Visit: Payer: Self-pay | Admitting: Family Medicine

## 2020-01-13 DIAGNOSIS — F341 Dysthymic disorder: Secondary | ICD-10-CM

## 2020-01-13 DIAGNOSIS — Z6379 Other stressful life events affecting family and household: Secondary | ICD-10-CM

## 2020-06-16 ENCOUNTER — Telehealth: Payer: Self-pay | Admitting: Physician Assistant

## 2020-06-16 DIAGNOSIS — I10 Essential (primary) hypertension: Secondary | ICD-10-CM

## 2020-06-16 MED ORDER — AMLODIPINE-VALSARTAN-HCTZ 5-160-12.5 MG PO TABS: 0.5000 | ORAL_TABLET | Freq: Every day | ORAL | 0 refills | Status: DC

## 2020-06-16 NOTE — Telephone Encounter (Signed)
Patient has an upcoming appt but is completely out of her BP meds, she sent a request over a week ago but it went to previous PCP by mistake. Can we please send a refill today to [facility] on [address]?

## 2020-06-16 NOTE — Telephone Encounter (Signed)
Refill sent to requested pharmacy. AS, CMA 

## 2020-06-16 NOTE — Addendum Note (Signed)
Addended by: Mickel Crow on: 06/16/2020 09:25 AM   Modules accepted: Orders

## 2020-06-27 ENCOUNTER — Encounter: Payer: Self-pay | Admitting: Physician Assistant

## 2020-06-27 ENCOUNTER — Ambulatory Visit (INDEPENDENT_AMBULATORY_CARE_PROVIDER_SITE_OTHER): Payer: 59 | Admitting: Physician Assistant

## 2020-06-27 ENCOUNTER — Other Ambulatory Visit: Payer: Self-pay

## 2020-06-27 VITALS — BP 125/76 | HR 65 | Temp 98.0°F | Ht 67.0 in | Wt 226.4 lb

## 2020-06-27 DIAGNOSIS — Z6835 Body mass index (BMI) 35.0-35.9, adult: Secondary | ICD-10-CM

## 2020-06-27 DIAGNOSIS — I10 Essential (primary) hypertension: Secondary | ICD-10-CM

## 2020-06-27 DIAGNOSIS — Z0000 Encounter for general adult medical examination without abnormal findings: Secondary | ICD-10-CM | POA: Diagnosis not present

## 2020-06-27 DIAGNOSIS — Z8639 Personal history of other endocrine, nutritional and metabolic disease: Secondary | ICD-10-CM

## 2020-06-27 DIAGNOSIS — Z2821 Immunization not carried out because of patient refusal: Secondary | ICD-10-CM | POA: Diagnosis not present

## 2020-06-27 DIAGNOSIS — Z1231 Encounter for screening mammogram for malignant neoplasm of breast: Secondary | ICD-10-CM

## 2020-06-27 DIAGNOSIS — Z1211 Encounter for screening for malignant neoplasm of colon: Secondary | ICD-10-CM

## 2020-06-27 MED ORDER — HYDROCHLOROTHIAZIDE 25 MG PO TABS: 12.5000 mg | ORAL_TABLET | Freq: Every day | ORAL | 0 refills | Status: DC

## 2020-06-27 MED ORDER — AMLODIPINE BESYLATE-VALSARTAN 5-160 MG PO TABS: 1.0000 | ORAL_TABLET | Freq: Every day | ORAL | 0 refills | Status: DC

## 2020-06-27 NOTE — Progress Notes (Signed)
Female Physical   Impression and Recommendations:    1. Healthcare maintenance   2. Essential hypertension   3. Influenza vaccination declined   4. History of hyperlipidemia   5. Screening for colon cancer   6. Encounter for screening mammogram for malignant neoplasm of breast   7. Class 2 severe obesity with serious comorbidity and body mass index (BMI) of 35.0 to 35.9 in adult, unspecified obesity type ([place])      1) Anticipatory Guidance: Discussed skin CA prevention and sunscreen when outside along with skin surveillance; eating a balanced and modest diet; physical activity at least 25 minutes per day or minimum of 150 min/ week moderate to intense activity.  2) Immunizations / Screenings / Labs:   All immunizations are up-to-date per recommendations or will be updated today if pt allows.    - Patient understands with dental and vision screens they will schedule independently.  - Will obtain CBC, CMP, HgA1c, Lipid panel, TSH and vit D when fasting, if not already done past 12 mo/ recently - UTD on Pap smear, Tdap, hep C and HIV screenings - Will place orders for screening mammogram and colonoscopy. - Declined influenza vaccine.  3) Weight:  Discussed goal to improve diet habits to improve overall feelings of well being and objective health data. Improve nutrient density of diet through increasing intake of fruits and vegetables and decreasing saturated fats, white flour products and refined sugars. -BMI 35.46: Associated with hypertension  4) Healthcare maintenance: -Continue current medication regimen. Provided refills. States pharmacy is not able to get triple combination of antihypertensive so will separate medications. -Follow a heart healthy diet and increase physical activity. Stay well-hydrated. -Follow-up in 6 months for regular OV: HTN, mood   Meds ordered this encounter  Medications  . hydrochlorothiazide (HYDRODIURIL) 25 MG tablet    Sig: Take 0.5 tablets (12.5  mg total) by mouth daily.    Dispense:  45 tablet    Refill:  0  . amLODipine-valsartan (EXFORGE) 5-160 MG tablet    Sig: Take 1 tablet by mouth daily.    Dispense:  90 tablet    Refill:  0    Orders Placed This Encounter  Procedures  . MM Digital Screening  . CBC with Differential/Platelet  . Comprehensive metabolic panel  . Hemoglobin A1c  . Lipid panel  . TSH  . Ambulatory referral to Gastroenterology     Return in about 6 months (around 12/26/2020) for HTN, Mood; lab visit for FBW in next few wks.     Gross side effects, risk and benefits, and alternatives of medications discussed with patient.  Patient is aware that all medications have potential side effects and we are unable to predict every side effect or drug-drug interaction that may occur.  Expresses verbal understanding and consents to current therapy plan and treatment regimen.  F-up preventative CPE in 1 year-  this is in addition to any chronic care visits.    Please see orders placed and AVS handed out to patient at the end of our visit for further patient instructions/ counseling done pertaining to today's office visit.  Note:  This note was prepared with assistance of Dragon voice recognition software. Occasional wrong-word or sound-a-like substitutions may have occurred due to the inherent limitations of voice recognition software.    Subjective:     CPE HPI: Sheri Cole is a 49 y.o. female who presents to [place] [facility] today a yearly health maintenance exam.  Health Maintenance Summary  - Reviewed and updated, unless pt declines services.  Last Cologuard or Colonoscopy:    Placed screening colonoscopy order. Tobacco History Reviewed:  Y, never a smoker Alcohol and/or drug use:    No concerns; no use Exercise Habits: Currently not very active Dental Home: Yes Eye exams: Yes Dermatology home: Yes  Female Health:  PAP Smear - last known results: 09/14/2019-  negative STD concerns:   none Lumps or breast concerns:  none Breast Cancer Family History: Yes, maternal great grandmother   Additional concerns beyond health maintenance issues: None    Immunization History  Administered Date(s) Administered  . Tdap 06/23/2012     Health Maintenance  Topic Date Due  . COVID-19 Vaccine (1) 07/13/2020 (Originally 06/14/1983)  . INFLUENZA VACCINE  12/07/2020 (Originally 04/09/2020)  . MAMMOGRAM  07/14/2020  . TETANUS/TDAP  06/23/2022  . PAP SMEAR-Modifier  09/13/2022  . Hepatitis C Screening  Completed  . HIV Screening  Completed     Wt Readings from Last 3 Encounters:  06/27/20 226 lb 6.4 oz (102.7 kg)  10/19/19 223 lb (101.2 kg)  03/17/19 216 lb (98 kg)   BP Readings from Last 3 Encounters:  06/27/20 125/76  08/17/18 121/79  08/04/18 127/80   Pulse Readings from Last 3 Encounters:  06/27/20 65  08/17/18 79  08/04/18 61     Past Medical History:  Diagnosis Date  . Endometrial polyp 2016  . Vaginal delivery 2005, 2008, 2010      Past Surgical History:  Procedure Laterality Date  . DILATATION & CURETTAGE/HYSTEROSCOPY WITH MYOSURE N/A 10/14/2014   Procedure: DILATATION & CURETTAGE/HYSTEROSCOPY WITH MYOSURE;  Surgeon: Floyce Stakes. Pamala Hurry, MD;  Location: [facility];  Service: Gynecology;  Laterality: N/A;  . NO PAST SURGERIES        Family History  Problem Relation Age of Onset  . Cancer Father        metastatic lung (smoker)  . Diabetes Father   . Cancer Maternal Grandmother        breast, leukemia  . Cancer Paternal Grandmother        ovarian or cervical  . Hypertension Neg Hx   . CAD Neg Hx   . Stroke Neg Hx       Social History   Substance and Sexual Activity  Drug Use No  ,   Social History   Substance and Sexual Activity  Alcohol Use Yes  . Alcohol/week: 0.0 standard drinks   Comment: occasionally  ,   Social History   Tobacco Use  Smoking Status Never Smoker  Smokeless Tobacco Never Used  ,    Social History   Substance and Sexual Activity  Sexual Activity Yes    Current Outpatient Medications on File Prior to Visit  Medication Sig Dispense Refill  . Calcium Carb-Cholecalciferol (CALCIUM 500+D) 500-200 MG-UNIT TABS Take 1 tablet by mouth daily.    Marland Kitchen FLUoxetine (PROZAC) 40 MG capsule TAKE 1 CAPSULE BY MOUTH EVERY DAY 90 capsule 0  . folic acid (FOLVITE) 1 MG tablet Take 1 mg by mouth daily.    . vitamin B-12 (CYANOCOBALAMIN) 1000 MCG tablet Take 1,000 mcg by mouth daily.     No current facility-administered medications on file prior to visit.    Allergies: Sulfonamide derivatives  Review of Systems: General:   Denies fever, chills, unexplained weight loss.  Optho/Auditory:   Denies visual changes, blurred vision/LOV Respiratory:   Denies SOB, DOE more than baseline levels.   Cardiovascular:  Denies chest pain, palpitations, new onset peripheral edema  Gastrointestinal:   Denies nausea, vomiting, diarrhea.  Genitourinary: Denies dysuria, freq/ urgency, flank pain .  Endocrine:     Denies hot or cold intolerance, polyuria, polydipsia. Musculoskeletal:   Denies unexplained myalgias, joint swelling, unexplained arthralgias, gait problems.  Skin:  Denies rash, suspicious lesions Neurological:     Denies dizziness, unexplained weakness, numbness  Psychiatric/Behavioral:   Denies mood changes, suicidal or homicidal ideations, hallucinations    Objective:    Blood pressure 125/76, pulse 65, temperature 98 F (36.7 C), temperature source Oral, height 5\' 7"  (1.702 m), weight 226 lb 6.4 oz (102.7 kg), last menstrual period 06/14/2020, SpO2 99 %. Body mass index is 35.46 kg/m. General Appearance:    Alert, cooperative, no distress, appears stated age  Head:    Normocephalic, without obvious abnormality, atraumatic  Eyes:    PERRL, conjunctiva/corneas clear, EOM's intact, both eyes  Ears:    Normal TM's and external ear canals, both ears  Nose:   Nares normal, septum  midline, mucosa normal, no drainage    or sinus tenderness  Throat:   Lips w/o lesion, mucosa moist, and tongue normal; teeth and   gums normal  Neck:   Supple, symmetrical, trachea midline, no adenopathy;    thyroid:  no enlargement/tenderness/nodules; no carotid   bruit or JVD  Back:     Symmetric, no curvature, ROM normal, no CVA tenderness  Lungs:     Clear to auscultation bilaterally, respirations unlabored, no       Wh/ R/ R  Chest Wall:    No tenderness or gross deformity; normal excursion   Heart:    Regular rate and rhythm, S1 and S2 normal, no murmur  Breast Exam:   Deferred to OB/GYN  Abdomen:     Soft, non-tender, bowel sounds active all four quadrants, No   G/R/R, no masses, no organomegaly; healing ecchymosis lesion at mid abdomen  Genitalia:   Deferred to OB/GYN  Rectal:   Deferred to OB/GYN  Extremities:   Extremities normal, atraumatic, no cyanosis or gross edema  Pulses:   2+ and symmetric all extremities  Skin:   Warm, dry, Skin color, texture, turgor normal, no obvious rashes or lesions Psych: No HI/SI, judgement and insight good, Euthymic mood. Full Affect.  Neurologic:   CNII-XII grossly intact, normal strength, sensation and reflexes throughout

## 2020-06-27 NOTE — Patient Instructions (Signed)

## 2020-06-29 ENCOUNTER — Other Ambulatory Visit: Payer: 59

## 2020-06-29 ENCOUNTER — Other Ambulatory Visit: Payer: Self-pay

## 2020-06-29 DIAGNOSIS — Z0000 Encounter for general adult medical examination without abnormal findings: Secondary | ICD-10-CM

## 2020-06-30 LAB — LIPID PANEL
Chol/HDL Ratio: 3.9 ratio (ref 0.0–4.4)
Cholesterol, Total: 230 mg/dL — ABNORMAL HIGH (ref 100–199)
HDL: 59 mg/dL (ref >39)
LDL Chol Calc (NIH): 146 mg/dL — ABNORMAL HIGH (ref 0–99)
Triglycerides: 142 mg/dL (ref 0–149)
VLDL Cholesterol Cal: 25 mg/dL (ref 5–40)

## 2020-06-30 LAB — COMPREHENSIVE METABOLIC PANEL
ALT: 22 IU/L (ref 0–32)
AST: 21 IU/L (ref 0–40)
Albumin/Globulin Ratio: 1.8 (ref 1.2–2.2)
Albumin: 4.4 g/dL (ref 3.8–4.8)
Alkaline Phosphatase: 40 IU/L — ABNORMAL LOW (ref 44–121)
BUN/Creatinine Ratio: 17 (ref 9–23)
BUN: 12 mg/dL (ref 6–24)
Bilirubin Total: 0.4 mg/dL (ref 0.0–1.2)
CO2: 21 mmol/L (ref 20–29)
Calcium: 9.6 mg/dL (ref 8.7–10.2)
Chloride: 102 mmol/L (ref 96–106)
Creatinine, Ser: 0.7 mg/dL (ref 0.57–1.00)
GFR calc Af Amer: 118 mL/min/{1.73_m2} (ref >59)
GFR calc non Af Amer: 103 mL/min/{1.73_m2} (ref >59)
Globulin, Total: 2.5 g/dL (ref 1.5–4.5)
Glucose: 88 mg/dL (ref 65–99)
Potassium: 4.7 mmol/L (ref 3.5–5.2)
Sodium: 139 mmol/L (ref 134–144)
Total Protein: 6.9 g/dL (ref 6.0–8.5)

## 2020-06-30 LAB — HEMOGLOBIN A1C

## 2020-06-30 LAB — TSH: TSH: 1.67 u[IU]/mL (ref 0.450–4.500)

## 2020-06-30 LAB — CBC WITH DIFFERENTIAL/PLATELET

## 2020-07-13 ENCOUNTER — Other Ambulatory Visit: Payer: Self-pay | Admitting: Physician Assistant

## 2020-09-24 ENCOUNTER — Other Ambulatory Visit: Payer: Self-pay | Admitting: Physician Assistant

## 2020-09-24 DIAGNOSIS — I10 Essential (primary) hypertension: Secondary | ICD-10-CM

## 2020-09-30 ENCOUNTER — Other Ambulatory Visit: Payer: Self-pay | Admitting: Physician Assistant

## 2020-09-30 DIAGNOSIS — F341 Dysthymic disorder: Secondary | ICD-10-CM

## 2020-09-30 DIAGNOSIS — Z6379 Other stressful life events affecting family and household: Secondary | ICD-10-CM

## 2020-12-25 ENCOUNTER — Telehealth: Payer: Self-pay | Admitting: Physician Assistant

## 2020-12-25 ENCOUNTER — Other Ambulatory Visit: Payer: Self-pay | Admitting: Physician Assistant

## 2020-12-25 DIAGNOSIS — I10 Essential (primary) hypertension: Secondary | ICD-10-CM

## 2020-12-25 NOTE — Telephone Encounter (Signed)
Please contact patient to schedule apt per last AVS for further medication refills. AS, CMA 

## 2020-12-27 NOTE — Telephone Encounter (Signed)
Left voice mail

## 2021-01-04 ENCOUNTER — Other Ambulatory Visit: Payer: Self-pay | Admitting: Physician Assistant

## 2021-01-04 DIAGNOSIS — Z0000 Encounter for general adult medical examination without abnormal findings: Secondary | ICD-10-CM

## 2021-01-04 DIAGNOSIS — Z1231 Encounter for screening mammogram for malignant neoplasm of breast: Secondary | ICD-10-CM

## 2021-01-08 ENCOUNTER — Other Ambulatory Visit: Payer: Self-pay | Admitting: Physician Assistant

## 2021-01-08 DIAGNOSIS — I10 Essential (primary) hypertension: Secondary | ICD-10-CM

## 2021-04-09 ENCOUNTER — Other Ambulatory Visit: Payer: Self-pay | Admitting: Physician Assistant

## 2021-04-09 DIAGNOSIS — F341 Dysthymic disorder: Secondary | ICD-10-CM

## 2021-04-09 DIAGNOSIS — Z6379 Other stressful life events affecting family and household: Secondary | ICD-10-CM

## 2021-04-18 LAB — HM PAP SMEAR: HM Pap smear: NEGATIVE

## 2021-04-18 LAB — HM MAMMOGRAPHY

## 2021-04-18 LAB — HM HEPATITIS C SCREENING LAB: HM Hepatitis Screen: NEGATIVE

## 2021-04-19 LAB — HM HIV SCREENING LAB: HM HIV Screening: NEGATIVE

## 2021-04-20 ENCOUNTER — Other Ambulatory Visit: Payer: Self-pay | Admitting: Physician Assistant

## 2021-04-20 ENCOUNTER — Other Ambulatory Visit: Payer: Self-pay | Admitting: Obstetrics

## 2021-04-20 DIAGNOSIS — R921 Mammographic calcification found on diagnostic imaging of breast: Secondary | ICD-10-CM

## 2021-04-20 DIAGNOSIS — N6489 Other specified disorders of breast: Secondary | ICD-10-CM

## 2021-04-25 ENCOUNTER — Other Ambulatory Visit: Payer: Self-pay

## 2021-04-25 ENCOUNTER — Ambulatory Visit (INDEPENDENT_AMBULATORY_CARE_PROVIDER_SITE_OTHER): Payer: Managed Care, Other (non HMO) | Admitting: Physician Assistant

## 2021-04-25 ENCOUNTER — Encounter: Payer: Self-pay | Admitting: Physician Assistant

## 2021-04-25 ENCOUNTER — Ambulatory Visit: Payer: Self-pay

## 2021-04-25 VITALS — BP 127/74 | HR 77 | Temp 98.5°F | Ht 67.0 in | Wt 247.4 lb

## 2021-04-25 DIAGNOSIS — F341 Dysthymic disorder: Secondary | ICD-10-CM | POA: Diagnosis not present

## 2021-04-25 DIAGNOSIS — I10 Essential (primary) hypertension: Secondary | ICD-10-CM | POA: Diagnosis not present

## 2021-04-25 DIAGNOSIS — Z6379 Other stressful life events affecting family and household: Secondary | ICD-10-CM

## 2021-04-25 MED ORDER — FLUOXETINE HCL 40 MG PO CAPS: 40.0000 mg | ORAL_CAPSULE | Freq: Every day | ORAL | 1 refills | Status: DC

## 2021-04-25 MED ORDER — HYDROCHLOROTHIAZIDE 25 MG PO TABS: 12.5000 mg | ORAL_TABLET | Freq: Every day | ORAL | 1 refills | Status: DC

## 2021-04-25 MED ORDER — AMLODIPINE BESYLATE-VALSARTAN 5-160 MG PO TABS: 1.0000 | ORAL_TABLET | Freq: Every day | ORAL | 1 refills | Status: DC

## 2021-04-25 NOTE — Patient Instructions (Signed)
https://www.nhlbi.nih.gov/files/docs/public/heart/dash_brief.pdf">  DASH Eating Plan DASH stands for Dietary Approaches to Stop Hypertension. The DASH eating plan is a healthy eating plan that has been shown to: Reduce high blood pressure (hypertension). Reduce your risk for type 2 diabetes, heart disease, and stroke. Help with weight loss. What are tips for following this plan? Reading food labels Check food labels for the amount of salt (sodium) per serving. Choose foods with less than 5 percent of the Daily Value of sodium. Generally, foods with less than 300 milligrams (mg) of sodium per serving fit into this eating plan. To find whole grains, look for the word "whole" as the first word in the ingredient list. Shopping Buy products labeled as "low-sodium" or "no salt added." Buy fresh foods. Avoid canned foods and pre-made or frozen meals. Cooking Avoid adding salt when cooking. Use salt-free seasonings or herbs instead of table salt or sea salt. Check with your health care provider or pharmacist before using salt substitutes. Do not fry foods. Cook foods using healthy methods such as baking, boiling, grilling, roasting, and broiling instead. Cook with heart-healthy oils, such as olive, canola, avocado, soybean, or sunflower oil. Meal planning  Eat a balanced diet that includes: 4 or more servings of fruits and 4 or more servings of vegetables each day. Try to fill one-half of your plate with fruits and vegetables. 6-8 servings of whole grains each day. Less than 6 oz (170 g) of lean meat, poultry, or fish each day. A 3-oz (85-g) serving of meat is about the same size as a deck of cards. One egg equals 1 oz (28 g). 2-3 servings of low-fat dairy each day. One serving is 1 cup (237 mL). 1 serving of nuts, seeds, or beans 5 times each week. 2-3 servings of heart-healthy fats. Healthy fats called omega-3 fatty acids are found in foods such as walnuts, flaxseeds, fortified milks, and eggs.  These fats are also found in cold-water fish, such as sardines, salmon, and mackerel. Limit how much you eat of: Canned or prepackaged foods. Food that is high in trans fat, such as some fried foods. Food that is high in saturated fat, such as fatty meat. Desserts and other sweets, sugary drinks, and other foods with added sugar. Full-fat dairy products. Do not salt foods before eating. Do not eat more than 4 egg yolks a week. Try to eat at least 2 vegetarian meals a week. Eat more home-cooked food and less restaurant, buffet, and fast food.  Lifestyle When eating at a restaurant, ask that your food be prepared with less salt or no salt, if possible. If you drink alcohol: Limit how much you use to: 0-1 drink a day for women who are not pregnant. 0-2 drinks a day for men. Be aware of how much alcohol is in your drink. In the [place] one drink equals one 12 oz bottle of beer (355 mL), one 5 oz glass of wine (148 mL), or one 1 oz glass of hard liquor (44 mL). General information Avoid eating more than 2,300 mg of salt a day. If you have hypertension, you may need to reduce your sodium intake to 1,500 mg a day. Work with your health care provider to maintain a healthy body weight or to lose weight. Ask what an ideal weight is for you. Get at least 30 minutes of exercise that causes your heart to beat faster (aerobic exercise) most days of the week. Activities may include walking, swimming, or biking. Work with your health care provider   or dietitian to adjust your eating plan to your individual calorie needs. What foods should I eat? Fruits All fresh, dried, or frozen fruit. Canned fruit in natural juice (without addedsugar). Vegetables Fresh or frozen vegetables (raw, steamed, roasted, or grilled). Low-sodium or reduced-sodium tomato and vegetable juice. Low-sodium or reduced-sodium tomatosauce and tomato paste. Low-sodium or reduced-sodium canned vegetables. Grains Whole-grain or  whole-wheat bread. Whole-grain or whole-wheat pasta. Brown rice. Oatmeal. Quinoa. Bulgur. Whole-grain and low-sodium cereals. Pita bread.Low-fat, low-sodium crackers. Whole-wheat flour tortillas. Meats and other proteins Skinless chicken or turkey. Ground chicken or turkey. Pork with fat trimmed off. Fish and seafood. Egg whites. Dried beans, peas, or lentils. Unsalted nuts, nut butters, and seeds. Unsalted canned beans. Lean cuts of beef with fat trimmed off. Low-sodium, lean precooked or cured meat, such as sausages or meatloaves. Dairy Low-fat (1%) or fat-free (skim) milk. Reduced-fat, low-fat, or fat-free cheeses. Nonfat, low-sodium ricotta or cottage cheese. Low-fat or nonfatyogurt. Low-fat, low-sodium cheese. Fats and oils Soft margarine without trans fats. Vegetable oil. Reduced-fat, low-fat, or light mayonnaise and salad dressings (reduced-sodium). Canola, safflower, olive, avocado, soybean, andsunflower oils. Avocado. Seasonings and condiments Herbs. Spices. Seasoning mixes without salt. Other foods Unsalted popcorn and pretzels. Fat-free sweets. The items listed above may not be a complete list of foods and beverages you can eat. Contact a dietitian for more information. What foods should I avoid? Fruits Canned fruit in a light or heavy syrup. Fried fruit. Fruit in cream or buttersauce. Vegetables Creamed or fried vegetables. Vegetables in a cheese sauce. Regular canned vegetables (not low-sodium or reduced-sodium). Regular canned tomato sauce and paste (not low-sodium or reduced-sodium). Regular tomato and vegetable juice(not low-sodium or reduced-sodium). Pickles. Olives. Grains Baked goods made with fat, such as croissants, muffins, or some breads. Drypasta or rice meal packs. Meats and other proteins Fatty cuts of meat. Ribs. Fried meat. Bacon. Bologna, salami, and other precooked or cured meats, such as sausages or meat loaves. Fat from the back of a pig (fatback). Bratwurst.  Salted nuts and seeds. Canned beans with added salt. Canned orsmoked fish. Whole eggs or egg yolks. Chicken or turkey with skin. Dairy Whole or 2% milk, cream, and half-and-half. Whole or full-fat cream cheese. Whole-fat or sweetened yogurt. Full-fat cheese. Nondairy creamers. Whippedtoppings. Processed cheese and cheese spreads. Fats and oils Butter. Stick margarine. Lard. Shortening. Ghee. Bacon fat. Tropical oils, suchas coconut, palm kernel, or palm oil. Seasonings and condiments Onion salt, garlic salt, seasoned salt, table salt, and sea salt. Worcestershire sauce. Tartar sauce. Barbecue sauce. Teriyaki sauce. Soy sauce, including reduced-sodium. Steak sauce. Canned and packaged gravies. Fish sauce. Oyster sauce. Cocktail sauce. Store-bought horseradish. Ketchup. Mustard. Meat flavorings and tenderizers. Bouillon cubes. Hot sauces. Pre-made or packaged marinades. Pre-made or packaged taco seasonings. Relishes. Regular saladdressings. Other foods Salted popcorn and pretzels. The items listed above may not be a complete list of foods and beverages you should avoid. Contact a dietitian for more information. Where to find more information National Heart, Lung, and Blood Institute: www.nhlbi.nih.gov American Heart Association: www.heart.org Academy of Nutrition and Dietetics: www.eatright.org National Kidney Foundation: www.kidney.org Summary The DASH eating plan is a healthy eating plan that has been shown to reduce high blood pressure (hypertension). It may also reduce your risk for type 2 diabetes, heart disease, and stroke. When on the DASH eating plan, aim to eat more fresh fruits and vegetables, whole grains, lean proteins, low-fat dairy, and heart-healthy fats. With the DASH eating plan, you should limit salt (sodium) intake to 2,300   mg a day. If you have hypertension, you may need to reduce your sodium intake to 1,500 mg a day. Work with your health care provider or dietitian to adjust  your eating plan to your individual calorie needs. This information is not intended to replace advice given to you by your health care provider. Make sure you discuss any questions you have with your healthcare provider. Document Revised: 07/30/2019 Document Reviewed: 07/30/2019 Elsevier Patient Education  2022 Elsevier Inc.  

## 2021-04-25 NOTE — Assessment & Plan Note (Signed)
-  BP in office at goal. -Continue current medication regimen. Provided refills. -Will collect CMP for medication monitoring with CPE in 2 months. -Will continue to monitor.

## 2021-04-25 NOTE — Progress Notes (Signed)
Established Patient Office Visit  Subjective:  Patient ID: Sheri Cole, female    DOB: 02/22/1971  Age: 50 y.o. MRN: [id number]  CC:  Chief Complaint  Patient presents with   Hypertension     HPI [facility] presents for follow up on hypertension and mood management.  HTN: Pt denies chest pain, palpitations, dizziness or frequent lower extremity swelling. Patient states has noticed edema when she is sitting for extended period which improves with elevation and walking. Taking medication as directed without side effects. Reports her blood pressure was elevated when she was at her dentist appointment a few weeks ago but was normal at her OB/GYN appointment last week. Pt follows a low salt diet. Reports good hydration.  Mood: Reports medication compliance. Denies mood changes or SI/HI. States is working from home and the summer has been stressful with her children being home but are getting ready to return to school.   Past Medical History:  Diagnosis Date   Endometrial polyp 2016   Vaginal delivery 2005, 2008, 2010    Past Surgical History:  Procedure Laterality Date   DILATATION & CURETTAGE/HYSTEROSCOPY WITH MYOSURE N/A 10/14/2014   Procedure: DILATATION & CURETTAGE/HYSTEROSCOPY WITH MYOSURE;  Surgeon: Floyce Stakes. Pamala Hurry, MD;  Location: [facility];  Service: Gynecology;  Laterality: N/A;   NO PAST SURGERIES      Family History  Problem Relation Age of Onset   Cancer Father        metastatic lung (smoker)   Diabetes Father    Cancer Maternal Grandmother        breast, leukemia   Cancer Paternal Grandmother        ovarian or cervical   Hypertension Neg Hx    CAD Neg Hx    Stroke Neg Hx     Social History   Socioeconomic History   Marital status: Widowed    Spouse name: Not on file   Number of children: Not on file   Years of education: Not on file   Highest education level: Not on file  Occupational History   Not on file  Tobacco Use   Smoking  status: Never   Smokeless tobacco: Never  Vaping Use   Vaping Use: Never used  Substance and Sexual Activity   Alcohol use: Yes    Alcohol/week: 0.0 standard drinks    Comment: occasionally   Drug use: No   Sexual activity: Yes  Other Topics Concern   Not on file  Social History Narrative   Husband committed suicide 12/2012   Lives with children (4yo, 6yo, 9yo)   Work's for [employer].   Enjoys reading, spending time with family.   Social Determinants of Health   Financial Resource Strain: Not on file  Food Insecurity: Not on file  Transportation Needs: Not on file  Physical Activity: Not on file  Stress: Not on file  Social Connections: Not on file  Intimate Partner Violence: Not on file    Outpatient Medications Prior to Visit  Medication Sig Dispense Refill   Calcium Carb-Cholecalciferol 500-200 MG-UNIT TABS Take 1 tablet by mouth daily.     folic acid (FOLVITE) 1 MG tablet Take 1 mg by mouth daily.     vitamin B-12 (CYANOCOBALAMIN) 1000 MCG tablet Take 1,000 mcg by mouth daily.     amLODipine-valsartan (EXFORGE) 5-160 MG tablet Take 1 tablet by mouth daily. **NEEDS APT FOR REFILLS** 60 tablet 0   FLUoxetine (PROZAC) 40 MG capsule Take 1 capsule (40 mg  total) by mouth daily. **PLEASE CONTACT OUR OFFICE TO SCHEDULE A FOLLOW UP APPOINTMENT** 30 capsule 0   hydrochlorothiazide (HYDRODIURIL) 25 MG tablet Take 0.5 tablets (12.5 mg total) by mouth daily. **NEEDS APT FOR REFILLS** 30 tablet 0   No facility-administered medications prior to visit.    Allergies  Allergen Reactions   Sulfonamide Derivatives Hives    ROS Review of Systems Review of Systems:  A fourteen system review of systems was performed and found to be positive as per HPI.   Objective:    Physical Exam General:  Well Developed, well nourished, appropriate for stated age.  Neuro:  Alert and oriented,  extra-ocular muscles intact  HEENT:  Normocephalic, atraumatic, neck supple Skin:  no gross rash,  warm, pink. Cardiac:  RRR, S1 S2 Respiratory:  CTA B/L, Not using accessory muscles, speaking in full sentences- unlabored. Vascular:  Ext warm, no cyanosis apprec.; cap RF less 2 sec. Trace edema  Psych:  No HI/SI, judgement and insight good, Euthymic mood. Full Affect.  BP 127/74   Pulse 77   Temp 98.5 F (36.9 C)   Ht '5\' 7"'$  (1.702 m)   Wt 247 lb 6.4 oz (112.2 kg)   LMP  (LMP Unknown)   SpO2 98%   BMI 38.75 kg/m  Wt Readings from Last 3 Encounters:  04/25/21 247 lb 6.4 oz (112.2 kg)  06/27/20 226 lb 6.4 oz (102.7 kg)  10/19/19 223 lb (101.2 kg)     Health Maintenance Due  Topic Date Due   COLONOSCOPY (Pts 45-49yr Insurance coverage will need to be confirmed)  Never done   MAMMOGRAM  07/14/2020   COVID-19 Vaccine (3 - Booster for [place]) 01/05/2021   INFLUENZA VACCINE  04/09/2021    There are no preventive care reminders to display for this patient.  Lab Results  Component Value Date   TSH 1.670 06/29/2020   Lab Results  Component Value Date   WBC CANCELED 06/29/2020   HGB 12.5 03/02/2018   HCT 36.7 03/02/2018   MCV 86 03/02/2018   PLT 281 03/02/2018   Lab Results  Component Value Date   NA 139 06/29/2020   K 4.7 06/29/2020   CO2 21 06/29/2020   GLUCOSE 88 06/29/2020   BUN 12 06/29/2020   CREATININE 0.70 06/29/2020   BILITOT 0.4 06/29/2020   ALKPHOS 40 (L) 06/29/2020   AST 21 06/29/2020   ALT 22 06/29/2020   PROT 6.9 06/29/2020   ALBUMIN 4.4 06/29/2020   CALCIUM 9.6 06/29/2020   ANIONGAP 5 10/14/2014   Lab Results  Component Value Date   CHOL 230 (H) 06/29/2020   Lab Results  Component Value Date   HDL 59 06/29/2020   Lab Results  Component Value Date   LDLCALC 146 (H) 06/29/2020   Lab Results  Component Value Date   TRIG 142 06/29/2020   Lab Results  Component Value Date   CHOLHDL 3.9 06/29/2020   Lab Results  Component Value Date   HGBA1C CANCELED 06/29/2020      Assessment & Plan:   Problem List Items Addressed This  Visit       Cardiovascular and Mediastinum   Essential hypertension - Primary    -BP in office at goal. -Continue current medication regimen. Provided refills. -Will collect CMP for medication monitoring with CPE in 2 months. -Will continue to monitor.      Relevant Medications   amLODipine-valsartan (EXFORGE) 5-160 MG tablet   hydrochlorothiazide (HYDRODIURIL) 25 MG tablet  Other   Stress in life due to family/ household (Chronic)   Relevant Medications   FLUoxetine (PROZAC) 40 MG capsule   Dysthymia   Relevant Medications   FLUoxetine (PROZAC) 40 MG capsule   Dysthymia, Stress: -PHQ-9 score of 3, stable. -Continue current medication regimen. Provided refill. -Will continue to monitor.   Meds ordered this encounter  Medications   amLODipine-valsartan (EXFORGE) 5-160 MG tablet    Sig: Take 1 tablet by mouth daily.    Dispense:  90 tablet    Refill:  1    Order Specific Question:   Supervising Provider    Answer:   Beatrice Lecher D [[id number]]   FLUoxetine (PROZAC) 40 MG capsule    Sig: Take 1 capsule (40 mg total) by mouth daily.    Dispense:  90 capsule    Refill:  1    Order Specific Question:   Supervising Provider    Answer:   Beatrice Lecher D [[id number]]   hydrochlorothiazide (HYDRODIURIL) 25 MG tablet    Sig: Take 0.5 tablets (12.5 mg total) by mouth daily.    Dispense:  45 tablet    Refill:  1    Order Specific Question:   Supervising Provider    Answer:   Beatrice Lecher D [[id number]]    Follow-up: Return in about 2 months (around 06/25/2021) for CPE and FBW 1 wk prior .    Lorrene Reid, PA-C

## 2021-04-28 ENCOUNTER — Ambulatory Visit
Admission: RE | Admit: 2021-04-28 | Discharge: 2021-04-28 | Disposition: A | Discharge status: Home / Self Care | Payer: Self-pay | Source: Ambulatory Visit | Attending: Physician Assistant | Admitting: Physician Assistant

## 2021-04-28 ENCOUNTER — Ambulatory Visit
Admission: RE | Admit: 2021-04-28 | Discharge: 2021-04-28 | Disposition: A | Discharge status: Home / Self Care | Payer: Managed Care, Other (non HMO) | Source: Ambulatory Visit | Attending: Physician Assistant | Admitting: Physician Assistant

## 2021-04-28 ENCOUNTER — Other Ambulatory Visit: Payer: Self-pay

## 2021-04-28 ENCOUNTER — Other Ambulatory Visit: Payer: Self-pay | Admitting: Obstetrics

## 2021-04-28 DIAGNOSIS — N6489 Other specified disorders of breast: Secondary | ICD-10-CM

## 2021-04-30 ENCOUNTER — Other Ambulatory Visit: Payer: Self-pay

## 2021-04-30 DIAGNOSIS — I10 Essential (primary) hypertension: Secondary | ICD-10-CM

## 2021-04-30 DIAGNOSIS — Z6379 Other stressful life events affecting family and household: Secondary | ICD-10-CM

## 2021-04-30 DIAGNOSIS — F341 Dysthymic disorder: Secondary | ICD-10-CM

## 2021-04-30 MED ORDER — FLUOXETINE HCL 40 MG PO CAPS: 40.0000 mg | ORAL_CAPSULE | Freq: Every day | ORAL | 1 refills | Status: DC

## 2021-04-30 MED ORDER — AMLODIPINE BESYLATE-VALSARTAN 5-160 MG PO TABS: 1.0000 | ORAL_TABLET | Freq: Every day | ORAL | 1 refills | Status: DC

## 2021-04-30 MED ORDER — HYDROCHLOROTHIAZIDE 25 MG PO TABS: 12.5000 mg | ORAL_TABLET | Freq: Every day | ORAL | 1 refills | Status: DC

## 2021-05-02 ENCOUNTER — Ambulatory Visit
Admission: RE | Admit: 2021-05-02 | Discharge: 2021-05-02 | Disposition: A | Discharge status: Home / Self Care | Payer: Managed Care, Other (non HMO) | Source: Ambulatory Visit | Attending: Obstetrics | Admitting: Obstetrics

## 2021-05-02 ENCOUNTER — Other Ambulatory Visit: Payer: Self-pay | Admitting: Body Imaging

## 2021-05-02 ENCOUNTER — Other Ambulatory Visit: Payer: Self-pay

## 2021-05-02 ENCOUNTER — Encounter: Payer: Self-pay | Admitting: Physician Assistant

## 2021-05-02 DIAGNOSIS — N6489 Other specified disorders of breast: Secondary | ICD-10-CM

## 2021-05-21 ENCOUNTER — Ambulatory Visit: Payer: Self-pay | Admitting: General Surgery

## 2021-05-21 DIAGNOSIS — N6311 Unspecified lump in the right breast, upper outer quadrant: Secondary | ICD-10-CM | POA: Insufficient documentation

## 2021-05-21 DIAGNOSIS — N6320 Unspecified lump in the left breast, unspecified quadrant: Secondary | ICD-10-CM

## 2021-05-24 ENCOUNTER — Other Ambulatory Visit: Payer: Self-pay | Admitting: General Surgery

## 2021-05-24 DIAGNOSIS — N6320 Unspecified lump in the left breast, unspecified quadrant: Secondary | ICD-10-CM

## 2021-05-24 DIAGNOSIS — N63 Unspecified lump in unspecified breast: Secondary | ICD-10-CM

## 2021-06-19 ENCOUNTER — Ambulatory Visit
Admission: RE | Admit: 2021-06-19 | Discharge: 2021-06-19 | Disposition: A | Discharge status: Home / Self Care | Payer: Managed Care, Other (non HMO) | Source: Ambulatory Visit | Attending: General Surgery | Admitting: General Surgery

## 2021-06-19 ENCOUNTER — Other Ambulatory Visit: Payer: Self-pay | Admitting: General Surgery

## 2021-06-19 DIAGNOSIS — N63 Unspecified lump in unspecified breast: Secondary | ICD-10-CM

## 2021-06-20 ENCOUNTER — Encounter (HOSPITAL_BASED_OUTPATIENT_CLINIC_OR_DEPARTMENT_OTHER): Payer: Self-pay | Admitting: General Surgery

## 2021-06-20 ENCOUNTER — Other Ambulatory Visit: Payer: Self-pay

## 2021-06-21 ENCOUNTER — Ambulatory Visit
Admission: RE | Admit: 2021-06-21 | Discharge: 2021-06-21 | Disposition: A | Discharge status: Home / Self Care | Payer: Managed Care, Other (non HMO) | Source: Ambulatory Visit | Attending: General Surgery | Admitting: General Surgery

## 2021-06-21 ENCOUNTER — Other Ambulatory Visit: Payer: Self-pay | Admitting: General Surgery

## 2021-06-21 DIAGNOSIS — N63 Unspecified lump in unspecified breast: Secondary | ICD-10-CM

## 2021-06-25 ENCOUNTER — Encounter (HOSPITAL_BASED_OUTPATIENT_CLINIC_OR_DEPARTMENT_OTHER)
Admission: RE | Admit: 2021-06-25 | Discharge: 2021-06-25 | Disposition: A | Discharge status: Home / Self Care | Payer: Managed Care, Other (non HMO) | Source: Ambulatory Visit | Attending: General Surgery | Admitting: General Surgery

## 2021-06-25 DIAGNOSIS — N6489 Other specified disorders of breast: Secondary | ICD-10-CM | POA: Diagnosis not present

## 2021-06-25 DIAGNOSIS — Z79899 Other long term (current) drug therapy: Secondary | ICD-10-CM | POA: Diagnosis not present

## 2021-06-25 DIAGNOSIS — Z01812 Encounter for preprocedural laboratory examination: Secondary | ICD-10-CM | POA: Diagnosis not present

## 2021-06-25 DIAGNOSIS — Z803 Family history of malignant neoplasm of breast: Secondary | ICD-10-CM | POA: Diagnosis not present

## 2021-06-25 DIAGNOSIS — Z882 Allergy status to sulfonamides status: Secondary | ICD-10-CM | POA: Diagnosis not present

## 2021-06-25 DIAGNOSIS — N6082 Other benign mammary dysplasias of left breast: Secondary | ICD-10-CM | POA: Diagnosis not present

## 2021-06-25 DIAGNOSIS — N649 Disorder of breast, unspecified: Secondary | ICD-10-CM | POA: Diagnosis present

## 2021-06-25 DIAGNOSIS — N6022 Fibroadenosis of left breast: Secondary | ICD-10-CM | POA: Diagnosis not present

## 2021-06-25 LAB — BASIC METABOLIC PANEL
Anion gap: 9 (ref 5–15)
BUN: 11 mg/dL (ref 6–20)
CO2: 26 mmol/L (ref 22–32)
Calcium: 9.1 mg/dL (ref 8.9–10.3)
Chloride: 104 mmol/L (ref 98–111)
Creatinine, Ser: 0.74 mg/dL (ref 0.44–1.00)
GFR, Estimated: >60 mL/min (ref >60)
Glucose, Bld: 103 mg/dL — ABNORMAL HIGH (ref 70–99)
Potassium: 4.3 mmol/L (ref 3.5–5.1)
Sodium: 139 mmol/L (ref 135–145)

## 2021-06-25 NOTE — Progress Notes (Signed)

## 2021-06-28 ENCOUNTER — Other Ambulatory Visit: Payer: Self-pay

## 2021-06-28 ENCOUNTER — Ambulatory Visit
Admission: RE | Admit: 2021-06-28 | Discharge: 2021-06-28 | Disposition: A | Discharge status: Home / Self Care | Payer: Managed Care, Other (non HMO) | Source: Ambulatory Visit | Attending: General Surgery | Admitting: General Surgery

## 2021-06-28 DIAGNOSIS — N6320 Unspecified lump in the left breast, unspecified quadrant: Secondary | ICD-10-CM

## 2021-06-28 NOTE — Anesthesia Preprocedure Evaluation (Addendum)
Anesthesia Evaluation  Patient identified by MRN, date of birth, ID band Patient awake    Reviewed: Allergy & Precautions, NPO status , Patient's Chart, lab work & pertinent test results  Airway Mallampati: II  TM Distance: >3 FB Neck ROM: Full    Dental  (+) Teeth Intact   Pulmonary neg pulmonary ROS,    Pulmonary exam normal        Cardiovascular hypertension, Pt. on medications  Rhythm:Regular Rate:Normal     Neuro/Psych Anxiety Depression negative neurological ROS     GI/Hepatic negative GI ROS, Neg liver ROS,   Endo/Other  negative endocrine ROS  Renal/GU negative Renal ROS  negative genitourinary   Musculoskeletal Left breast discordance    Abdominal (+)  Abdomen: soft.    Peds  Hematology negative hematology ROS (+)   Anesthesia Other Findings   Reproductive/Obstetrics                            Anesthesia Physical Anesthesia Plan  ASA: 2  Anesthesia Plan: General   Post-op Pain Management:    Induction: Intravenous  PONV Risk Score and Plan: 3 and Ondansetron, Dexamethasone, Midazolam and Treatment may vary due to age or medical condition  Airway Management Planned: Mask and LMA  Additional Equipment: None  Intra-op Plan:   Post-operative Plan: Extubation in OR  Informed Consent: I have reviewed the patients History and Physical, chart, labs and discussed the procedure including the risks, benefits and alternatives for the proposed anesthesia with the patient or authorized representative who has indicated his/her understanding and acceptance.     Dental advisory given  Plan Discussed with: CRNA  Anesthesia Plan Comments:        Anesthesia Quick Evaluation

## 2021-06-29 ENCOUNTER — Encounter (HOSPITAL_BASED_OUTPATIENT_CLINIC_OR_DEPARTMENT_OTHER): Payer: Self-pay | Admitting: General Surgery

## 2021-06-29 ENCOUNTER — Ambulatory Visit (HOSPITAL_BASED_OUTPATIENT_CLINIC_OR_DEPARTMENT_OTHER)
Admission: RE | Admit: 2021-06-29 | Discharge: 2021-06-29 | Disposition: A | Discharge status: Home / Self Care | Payer: Managed Care, Other (non HMO) | Attending: General Surgery | Admitting: General Surgery

## 2021-06-29 ENCOUNTER — Ambulatory Visit (HOSPITAL_BASED_OUTPATIENT_CLINIC_OR_DEPARTMENT_OTHER): Payer: Managed Care, Other (non HMO) | Admitting: Anesthesiology

## 2021-06-29 ENCOUNTER — Other Ambulatory Visit: Payer: Self-pay

## 2021-06-29 ENCOUNTER — Ambulatory Visit
Admission: RE | Admit: 2021-06-29 | Discharge: 2021-06-29 | Disposition: A | Discharge status: Home / Self Care | Payer: Managed Care, Other (non HMO) | Source: Ambulatory Visit | Attending: General Surgery | Admitting: General Surgery

## 2021-06-29 ENCOUNTER — Encounter (HOSPITAL_BASED_OUTPATIENT_CLINIC_OR_DEPARTMENT_OTHER)
Admission: RE | Disposition: A | Discharge status: Home / Self Care | Payer: Self-pay | Source: Home / Self Care | Attending: General Surgery

## 2021-06-29 DIAGNOSIS — N6022 Fibroadenosis of left breast: Secondary | ICD-10-CM | POA: Diagnosis not present

## 2021-06-29 DIAGNOSIS — N6082 Other benign mammary dysplasias of left breast: Secondary | ICD-10-CM | POA: Insufficient documentation

## 2021-06-29 DIAGNOSIS — N6489 Other specified disorders of breast: Secondary | ICD-10-CM | POA: Insufficient documentation

## 2021-06-29 DIAGNOSIS — N6320 Unspecified lump in the left breast, unspecified quadrant: Secondary | ICD-10-CM

## 2021-06-29 DIAGNOSIS — Z882 Allergy status to sulfonamides status: Secondary | ICD-10-CM | POA: Insufficient documentation

## 2021-06-29 DIAGNOSIS — Z803 Family history of malignant neoplasm of breast: Secondary | ICD-10-CM | POA: Insufficient documentation

## 2021-06-29 DIAGNOSIS — Z79899 Other long term (current) drug therapy: Secondary | ICD-10-CM | POA: Insufficient documentation

## 2021-06-29 HISTORY — DX: Depression, unspecified: F32.A

## 2021-06-29 HISTORY — DX: Anxiety disorder, unspecified: F41.9

## 2021-06-29 HISTORY — DX: Essential (primary) hypertension: I10

## 2021-06-29 HISTORY — PX: BREAST LUMPECTOMY WITH RADIOACTIVE SEED LOCALIZATION: SHX6424

## 2021-06-29 LAB — POCT PREGNANCY, URINE: Preg Test, Ur: NEGATIVE

## 2021-06-29 SURGERY — BREAST LUMPECTOMY WITH RADIOACTIVE SEED LOCALIZATION
Anesthesia: General | Site: Breast | Laterality: Left

## 2021-06-29 MED ORDER — LIDOCAINE HCL (CARDIAC) PF 100 MG/5ML IV SOSY: PREFILLED_SYRINGE | INTRAVENOUS | Status: DC | PRN

## 2021-06-29 MED ORDER — CHLORHEXIDINE GLUCONATE CLOTH 2 % EX PADS: 6.0000 | MEDICATED_PAD | Freq: Once | CUTANEOUS | Status: DC

## 2021-06-29 MED ORDER — GABAPENTIN 300 MG PO CAPS
300.0000 mg | ORAL_CAPSULE | ORAL | Status: AC
  Administered 2021-06-29: 300 mg via ORAL

## 2021-06-29 MED ORDER — BUPIVACAINE-EPINEPHRINE (PF) 0.25% -1:200000 IJ SOLN
INTRAMUSCULAR | Status: DC | PRN
  Administered 2021-06-29: 20 mL

## 2021-06-29 MED ORDER — FENTANYL CITRATE (PF) 100 MCG/2ML IJ SOLN
25.0000 ug | INTRAMUSCULAR | Status: DC | PRN
  Administered 2021-06-29: 50 ug via INTRAVENOUS

## 2021-06-29 MED ORDER — DEXAMETHASONE SODIUM PHOSPHATE 4 MG/ML IJ SOLN
INTRAMUSCULAR | Status: DC | PRN
  Administered 2021-06-29: 10 mg via INTRAVENOUS

## 2021-06-29 MED ORDER — PROPOFOL 10 MG/ML IV BOLUS
INTRAVENOUS | Status: DC | PRN
  Administered 2021-06-29: 200 mg via INTRAVENOUS

## 2021-06-29 MED ORDER — LIDOCAINE 2% (20 MG/ML) 5 ML SYRINGE
INTRAMUSCULAR | Status: AC
  Filled 2021-06-29: qty 5

## 2021-06-29 MED ORDER — EPHEDRINE 5 MG/ML INJ
INTRAVENOUS | Status: AC
  Filled 2021-06-29: qty 5

## 2021-06-29 MED ORDER — FENTANYL CITRATE (PF) 100 MCG/2ML IJ SOLN
INTRAMUSCULAR | Status: DC | PRN
  Administered 2021-06-29: 100 ug via INTRAVENOUS

## 2021-06-29 MED ORDER — HYDROCODONE-ACETAMINOPHEN 5-325 MG PO TABS: 1.0000 | ORAL_TABLET | Freq: Four times a day (QID) | ORAL | 0 refills | Status: DC | PRN

## 2021-06-29 MED ORDER — FENTANYL CITRATE (PF) 100 MCG/2ML IJ SOLN
INTRAMUSCULAR | Status: AC
  Filled 2021-06-29: qty 2

## 2021-06-29 MED ORDER — MIDAZOLAM HCL 5 MG/5ML IJ SOLN
INTRAMUSCULAR | Status: DC | PRN
  Administered 2021-06-29: 2 mg via INTRAVENOUS

## 2021-06-29 MED ORDER — EPHEDRINE SULFATE 50 MG/ML IJ SOLN
INTRAMUSCULAR | Status: DC | PRN
  Administered 2021-06-29: 15 mg via INTRAVENOUS
  Administered 2021-06-29: 10 mg via INTRAVENOUS

## 2021-06-29 MED ORDER — SUCCINYLCHOLINE CHLORIDE 200 MG/10ML IV SOSY
PREFILLED_SYRINGE | INTRAVENOUS | Status: AC
  Filled 2021-06-29: qty 10

## 2021-06-29 MED ORDER — PHENYLEPHRINE 40 MCG/ML (10ML) SYRINGE FOR IV PUSH (FOR BLOOD PRESSURE SUPPORT)
PREFILLED_SYRINGE | INTRAVENOUS | Status: AC
  Filled 2021-06-29: qty 10

## 2021-06-29 MED ORDER — CEFAZOLIN SODIUM-DEXTROSE 2-4 GM/100ML-% IV SOLN
INTRAVENOUS | Status: AC
  Filled 2021-06-29: qty 100

## 2021-06-29 MED ORDER — ONDANSETRON HCL 4 MG/2ML IJ SOLN
INTRAMUSCULAR | Status: AC
  Filled 2021-06-29: qty 2

## 2021-06-29 MED ORDER — DEXAMETHASONE SODIUM PHOSPHATE 10 MG/ML IJ SOLN
INTRAMUSCULAR | Status: AC
  Filled 2021-06-29: qty 1

## 2021-06-29 MED ORDER — OXYCODONE HCL 5 MG PO TABS
ORAL_TABLET | ORAL | Status: AC
  Filled 2021-06-29: qty 1

## 2021-06-29 MED ORDER — ACETAMINOPHEN 500 MG PO TABS
ORAL_TABLET | ORAL | Status: AC
  Filled 2021-06-29: qty 2

## 2021-06-29 MED ORDER — ATROPINE SULFATE 0.4 MG/ML IV SOLN
INTRAVENOUS | Status: AC
  Filled 2021-06-29: qty 1

## 2021-06-29 MED ORDER — OXYCODONE HCL 5 MG/5ML PO SOLN: 5.0000 mg | Freq: Once | ORAL | Status: AC | PRN

## 2021-06-29 MED ORDER — LACTATED RINGERS IV SOLN: INTRAVENOUS | Status: DC

## 2021-06-29 MED ORDER — LIDOCAINE 2% (20 MG/ML) 5 ML SYRINGE
INTRAMUSCULAR | Status: DC | PRN
  Administered 2021-06-29: 80 mg via INTRAVENOUS

## 2021-06-29 MED ORDER — ACETAMINOPHEN 500 MG PO TABS
1000.0000 mg | ORAL_TABLET | ORAL | Status: AC
  Administered 2021-06-29: 1000 mg via ORAL

## 2021-06-29 MED ORDER — MIDAZOLAM HCL 2 MG/2ML IJ SOLN
INTRAMUSCULAR | Status: AC
  Filled 2021-06-29: qty 2

## 2021-06-29 MED ORDER — CEFAZOLIN SODIUM-DEXTROSE 2-4 GM/100ML-% IV SOLN
2.0000 g | INTRAVENOUS | Status: AC
  Administered 2021-06-29: 2 g via INTRAVENOUS

## 2021-06-29 MED ORDER — GABAPENTIN 300 MG PO CAPS
ORAL_CAPSULE | ORAL | Status: AC
  Filled 2021-06-29: qty 1

## 2021-06-29 MED ORDER — OXYCODONE HCL 5 MG PO TABS
5.0000 mg | ORAL_TABLET | Freq: Once | ORAL | Status: AC | PRN
  Administered 2021-06-29: 5 mg via ORAL

## 2021-06-29 SURGICAL SUPPLY — 40 items
ADH SKN CLS APL DERMABOND .7 (GAUZE/BANDAGES/DRESSINGS) ×1
APL PRP STRL LF DISP 70% ISPRP (MISCELLANEOUS) ×1
APPLIER CLIP 9.375 MED OPEN (MISCELLANEOUS)
APR CLP MED 9.3 20 MLT OPN (MISCELLANEOUS)
BLADE SURG 15 STRL LF DISP TIS (BLADE) ×1 IMPLANT
BLADE SURG 15 STRL SS (BLADE) ×2
CANISTER SUC SOCK COL 7IN (MISCELLANEOUS) ×2 IMPLANT
CANISTER SUCT 1200ML W/VALVE (MISCELLANEOUS) ×2 IMPLANT
CHLORAPREP W/TINT 26 (MISCELLANEOUS) ×2 IMPLANT
CLIP APPLIE 9.375 MED OPEN (MISCELLANEOUS) IMPLANT
COVER BACK TABLE 60X90IN (DRAPES) ×2 IMPLANT
COVER MAYO STAND STRL (DRAPES) ×2 IMPLANT
COVER PROBE W GEL 5X96 (DRAPES) ×2 IMPLANT
DERMABOND ADVANCED (GAUZE/BANDAGES/DRESSINGS) ×1
DERMABOND ADVANCED .7 DNX12 (GAUZE/BANDAGES/DRESSINGS) ×1 IMPLANT
DRAPE LAPAROSCOPIC ABDOMINAL (DRAPES) ×2 IMPLANT
DRAPE UTILITY XL STRL (DRAPES) ×2 IMPLANT
ELECT COATED BLADE 2.86 ST (ELECTRODE) ×2 IMPLANT
ELECT REM PT RETURN 9FT ADLT (ELECTROSURGICAL) ×2
ELECTRODE REM PT RTRN 9FT ADLT (ELECTROSURGICAL) ×1 IMPLANT
GLOVE SURG ENC MOIS LTX SZ7.5 (GLOVE) ×4 IMPLANT
GLOVE SURG POLYISO LF SZ6.5 (GLOVE) ×2 IMPLANT
GLOVE SURG UNDER POLY LF SZ7 (GLOVE) ×2 IMPLANT
GOWN STRL REUS W/ TWL LRG LVL3 (GOWN DISPOSABLE) ×2 IMPLANT
GOWN STRL REUS W/TWL LRG LVL3 (GOWN DISPOSABLE) ×4
KIT MARKER MARGIN INK (KITS) ×2 IMPLANT
NEEDLE HYPO 25X1 1.5 SAFETY (NEEDLE) ×2 IMPLANT
NS IRRIG 1000ML POUR BTL (IV SOLUTION) ×2 IMPLANT
PACK BASIN DAY SURGERY FS (CUSTOM PROCEDURE TRAY) ×2 IMPLANT
PENCIL SMOKE EVACUATOR (MISCELLANEOUS) ×2 IMPLANT
SLEEVE SCD COMPRESS KNEE MED (STOCKING) ×2 IMPLANT
SPONGE T-LAP 18X18 ~~LOC~~+RFID (SPONGE) ×2 IMPLANT
SUT MON AB 4-0 PC3 18 (SUTURE) ×2 IMPLANT
SUT SILK 2 0 SH (SUTURE) IMPLANT
SUT VICRYL 3-0 CR8 SH (SUTURE) ×2 IMPLANT
SYR CONTROL 10ML LL (SYRINGE) ×2 IMPLANT
TOWEL GREEN STERILE FF (TOWEL DISPOSABLE) ×2 IMPLANT
TRAY FAXITRON CT DISP (TRAY / TRAY PROCEDURE) ×2 IMPLANT
TUBE CONNECTING 20X1/4 (TUBING) ×2 IMPLANT
YANKAUER SUCT BULB TIP NO VENT (SUCTIONS) IMPLANT

## 2021-06-29 NOTE — Anesthesia Postprocedure Evaluation (Signed)
Anesthesia Post Note  Patient: Sheri Cole  Procedure(s) Performed: LEFT BREAST LUMPECTOMY WITH RADIOACTIVE SEED LOCALIZATION (Left: Breast)     Patient location during evaluation: PACU Anesthesia Type: General Level of consciousness: awake and alert Pain management: pain level controlled Vital Signs Assessment: post-procedure vital signs reviewed and stable Respiratory status: spontaneous breathing, nonlabored ventilation, respiratory function stable and patient connected to nasal cannula oxygen Cardiovascular status: blood pressure returned to baseline and stable Postop Assessment: no apparent nausea or vomiting Anesthetic complications: no   No notable events documented.  Last Vitals:  Vitals:   06/29/21 1015 06/29/21 1030  BP:  138/74  Pulse: 85 85  Resp: 17 16  Temp:  36.6 C  SpO2: 91% 96%    Last Pain:  Vitals:   06/29/21 1030  TempSrc:   PainSc: 4                  Aman Bonet P Nakeyia Menden

## 2021-06-29 NOTE — H&P (Signed)
REFERRING PHYSICIAN: Ala Dach, MD  PROVIDER: Landry Corporal, MD  MRN: [id number] DOB: 1970-10-03  Subjective   Chief Complaint: No chief complaint on file.   History of Present Illness: Sheri Cole is a 50 y.o. female who is seen today as an office consultation at the request of Dr. Pamala Hurry for evaluation of No chief complaint on file. .   We are asked to see the patient in consultation by Dr. Larina Earthly to evaluate her for a left breast distortion. The patient is a 50 year old white female who recently went for routine screening mammogram. At that time she was found to have a small distortion in the upper outer quadrant of the left breast. This was biopsied and came back benign but the radiologist felt that this was discordant. She has a family history of breast cancer in her maternal grandmother. She is otherwise in good health.  Review of Systems: A complete review of systems was obtained from the patient. I have reviewed this information and discussed as appropriate with the patient. See HPI as well for other ROS.  ROS   Medical History: History reviewed. No pertinent past medical history.  Patient Active Problem List  Diagnosis   Mass of upper outer quadrant of right breast   Mass of upper outer quadrant of left breast   History reviewed. No pertinent surgical history.   Allergies  Allergen Reactions   Sulfa (Sulfonamide Antibiotics) Hives   Current Outpatient Medications on File Prior to Visit  Medication Sig Dispense Refill   amLODIPine-valsartan (EXFORGE) 5-160 mg tablet   cholecalciferol (CHOLECALCIFEROL) 1000 unit tablet Take by mouth   FLUoxetine (PROZAC) 40 MG capsule   folic acid (FOLVITE) 1 MG tablet   multivit-min/ferrous fumarate (MULTI VITAMIN ORAL) Take by mouth   No current facility-administered medications on file prior to visit.   Family History  Problem Relation Age of Onset   Diabetes Father    Social History   Tobacco Use   Smoking Status Never Smoker  Smokeless Tobacco Never Used    Social History   Socioeconomic History   Marital status: Widowed  Tobacco Use   Smoking status: Never Smoker   Smokeless tobacco: Never Used  Substance and Sexual Activity   Alcohol use: Yes   Drug use: Never   Objective:   Vitals:  Pulse: 96  Weight: (!) 111.6 kg (246 lb)  Height: 170.2 cm (5\' 7" )   Body mass index is 38.53 kg/m.  Physical Exam Vitals reviewed.  Constitutional:  General: She is not in acute distress. Appearance: Normal appearance.  HENT:  Head: Normocephalic and atraumatic.  Right Ear: External ear normal.  Left Ear: External ear normal.  Nose: Nose normal.  Mouth/Throat:  Mouth: Mucous membranes are moist.  Pharynx: Oropharynx is clear.  Eyes:  General: No scleral icterus. Extraocular Movements: Extraocular movements intact.  Conjunctiva/sclera: Conjunctivae normal.  Pupils: Pupils are equal, round, and reactive to light.  Cardiovascular:  Rate and Rhythm: Normal rate and regular rhythm.  Pulses: Normal pulses.  Heart sounds: Normal heart sounds.  Pulmonary:  Effort: Pulmonary effort is normal. No respiratory distress.  Breath sounds: Normal breath sounds.  Abdominal:  General: Bowel sounds are normal.  Palpations: Abdomen is soft.  Tenderness: There is no abdominal tenderness.  Musculoskeletal:  General: No swelling, tenderness or deformity. Normal range of motion.  Cervical back: Normal range of motion and neck supple.  Skin: General: Skin is warm and dry.  Coloration: Skin is not jaundiced.  Neurological:  General: No focal deficit present.  Mental Status: She is alert and oriented to person, place, and time.  Psychiatric:  Mood and Affect: Mood normal.  Behavior: Behavior normal.   Breast: There is no palpable mass in the left breast. There is a 2 cm round mobile palpable mass in the lateral right breast. There is no palpable axillary, supraclavicular, or cervical  lymphadenopathy.  Labs, Imaging and Diagnostic Testing:  Assessment and Plan:  Diagnoses and all orders for this visit:  Mass of upper outer quadrant of right breast - US breast limited right; Future  Mass of upper outer quadrant of left breast - [facility] Case Posting Request; Future    The patient has an area of distortion and discordance in the upper outer quadrant of the left breast. Because there is disagreement and because there is a 5 to 10% chance of missing something more significant the recommendation would be to have this area removed. She would also like to have this done. I have discussed with her in detail the risks and benefits of the operation as well as some of the technical aspects including the use of a radioactive seed for localization and she understands and wishes to proceed. She also has a palpable mass lateral in the right breast which is likely benign but we will further evaluate this with ultrasound before surgery.

## 2021-06-29 NOTE — Op Note (Signed)
06/29/2021  9:16 AM  PATIENT:  Sheri Cole  50 y.o. female  PRE-OPERATIVE DIAGNOSIS:  LEFT BREAST DISCORDANCE  POST-OPERATIVE DIAGNOSIS:  LEFT BREAST DISCORDANCE  PROCEDURE:  Procedure(s): LEFT BREAST LUMPECTOMY WITH RADIOACTIVE SEED LOCALIZATION (Left)  SURGEON:  Surgeon(s) and Role:    * Jovita Kussmaul, MD - Primary  PHYSICIAN ASSISTANT:   ASSISTANTS: none   ANESTHESIA:   local and general  EBL:  10 mL   BLOOD ADMINISTERED:none  DRAINS: none   LOCAL MEDICATIONS USED:  MARCAINE     SPECIMEN:  Source of Specimen:  left breast tissue  DISPOSITION OF SPECIMEN:  PATHOLOGY  COUNTS:  YES  TOURNIQUET:  * No tourniquets in log *  DICTATION: .Dragon Dictation  After informed consent was obtained the patient was brought to the operating room and placed in the supine position on the operating table.  After adequate induction of general anesthesia the patient's left breast was prepped with ChloraPrep, allowed to dry, and draped in usual sterile manner.  An appropriate timeout was performed.  Previously an I-125 seed was placed in the upper outer quadrant of the left breast to mark an area of discordance.  The neoprobe was set to I-125 in the area of radioactivity was readily identified.  The area around this was infiltrated with quarter percent Marcaine.  A curvilinear incision was made along the upper outer edge of the areola with a 15 blade knife.  The incision was carried through the skin and subcutaneous tissue sharply with the electrocautery.  Dissection was then carried sharply with the electrocautery towards the radioactive seed under the direction of the neoprobe.  Once I more closely approached the radioactive seed I then removed a circular portion of breast tissue sharply with the electrocautery around the radioactive seed while checking the area of radioactivity frequently.  Once the specimen was removed it was oriented with the appropriate paint colors.  A specimen  radiograph was obtained that showed the clip and seed to be near the center of the specimen.  The specimen was then sent to pathology for further evaluation.  Hemostasis was achieved using the Bovie electrocautery.  The wound was irrigated with saline and infiltrated with more quarter percent Marcaine.  The deep layer of the wound was then closed with layers of interrupted 3-0 Vicryl stitches.  The skin was then closed with interrupted 4-0 Monocryl subcuticular stitches.  Dermabond dressings were applied.  The patient tolerated the procedure well.  At the end of the case all needle sponge and instrument counts were correct.  The patient was then awakened and taken to recovery in stable condition.  PLAN OF CARE: Discharge to home after PACU  PATIENT DISPOSITION:  PACU - hemodynamically stable.   Delay start of Pharmacological VTE agent (>24hrs) due to surgical blood loss or risk of bleeding: not applicable

## 2021-06-29 NOTE — Transfer of Care (Signed)
Immediate Anesthesia Transfer of Care Note  Patient: Sheri Cole  Procedure(s) Performed: LEFT BREAST LUMPECTOMY WITH RADIOACTIVE SEED LOCALIZATION (Left: Breast)  Patient Location: PACU  Anesthesia Type:General  Level of Consciousness: awake, alert  and oriented  Airway & Oxygen Therapy: Patient Spontanous Breathing and Patient connected to face mask oxygen  Post-op Assessment: Report given to RN and Post -op Vital signs reviewed and stable  Post vital signs: Reviewed and stable  Last Vitals:  Vitals Value Taken Time  BP    Temp    Pulse 94 06/29/21 0921  Resp    SpO2 100 % 06/29/21 0921  Vitals shown include unvalidated device data.  Last Pain:  Vitals:   06/29/21 0738  TempSrc: Oral  PainSc: 0-No pain         Complications: No notable events documented.

## 2021-06-29 NOTE — Discharge Instructions (Signed)
  Post Anesthesia Home Care Instructions  Activity: Get plenty of rest for the remainder of the day. A responsible individual must stay with you for 24 hours following the procedure.  For the next 24 hours, DO NOT: -Drive a car -Paediatric nurse -Drink alcoholic beverages -Take any medication unless instructed by your physician -Make any legal decisions or sign important papers.  Meals: Start with liquid foods such as gelatin or soup. Progress to regular foods as tolerated. Avoid greasy, spicy, heavy foods. If nausea and/or vomiting occur, drink only clear liquids until the nausea and/or vomiting subsides. Call your physician if vomiting continues.  Special Instructions/Symptoms: Your throat may feel dry or sore from the anesthesia or the breathing tube placed in your throat during surgery. If this causes discomfort, gargle with warm salt water. The discomfort should disappear within 24 hours.  If you had a scopolamine patch placed behind your ear for the management of post- operative nausea and/or vomiting:  1. The medication in the patch is effective for 72 hours, after which it should be removed.  Wrap patch in a tissue and discard in the trash. Wash hands thoroughly with soap and water. 2. You may remove the patch earlier than 72 hours if you experience unpleasant side effects which may include dry mouth, dizziness or visual disturbances. 3. Avoid touching the patch. Wash your hands with soap and water after contact with the patch.  No tylenol until after 2pm today if needed.

## 2021-06-29 NOTE — Interval H&P Note (Signed)
History and Physical Interval Note:  06/29/2021 8:05 AM  Sheri Cole  has presented today for surgery, with the diagnosis of LEFT BREAST DISCORDANCE.  The various methods of treatment have been discussed with the patient and family. After consideration of risks, benefits and other options for treatment, the patient has consented to  Procedure(s): LEFT BREAST LUMPECTOMY WITH RADIOACTIVE SEED LOCALIZATION (Left) as a surgical intervention.  The patient's history has been reviewed, patient examined, no change in status, stable for surgery.  I have reviewed the patient's chart and labs.  Questions were answered to the patient's satisfaction.     Autumn Messing III

## 2021-06-29 NOTE — Anesthesia Procedure Notes (Signed)
Procedure Name: LMA Insertion Date/Time: 06/29/2021 8:28 AM Performed by: Willa Frater, CRNA Pre-anesthesia Checklist: Patient identified, Emergency Drugs available, Suction available and Patient being monitored Patient Re-evaluated:Patient Re-evaluated prior to induction Oxygen Delivery Method: Circle system utilized Preoxygenation: Pre-oxygenation with 100% oxygen Induction Type: IV induction Ventilation: Mask ventilation without difficulty LMA: LMA inserted LMA Size: 4.0 Number of attempts: 1 Airway Equipment and Method: Bite block Placement Confirmation: positive ETCO2 Tube secured with: Tape Dental Injury: Teeth and Oropharynx as per pre-operative assessment

## 2021-07-02 ENCOUNTER — Encounter (HOSPITAL_BASED_OUTPATIENT_CLINIC_OR_DEPARTMENT_OTHER): Payer: Self-pay | Admitting: General Surgery

## 2021-07-02 LAB — SURGICAL PATHOLOGY

## 2021-07-16 ENCOUNTER — Telehealth: Payer: Self-pay | Admitting: Hematology and Oncology

## 2021-07-16 NOTE — Telephone Encounter (Signed)
Scheduled appt per 11/3 referral. Pt is aware of appt date and time.  

## 2021-07-31 NOTE — Progress Notes (Signed)
[facility] NOTE  Patient Care Team: Lorrene Reid, PA-C as PCP - General Obgyn, Erling Conte as Attending Physician (Gynecology) Earnie Larsson, MD as Consulting Physician (Neurosurgery) Lavonna Monarch, MD as Consulting Physician (Dermatology) Aloha Gell, MD as Consulting Physician (Obstetrics and Gynecology)  CHIEF COMPLAINTS/PURPOSE OF CONSULTATION:  Newly diagnosed high risk of breast cancer  HISTORY OF PRESENTING ILLNESS:  Sheri Cole 50 y.o. female is here because of recent diagnosis of high risk of breast cancer. Screening mammogram on 04/18/2021 showed area of architectural distortion in the UOQ of the left breast 8 cm from the nipple. Diagnostic mammogram and [place] on 04/28/2021 showed persistent distortion in the upper outer left breast. Biopsy of the left breast on 05/02/2021 showed no evidence of malignancy. Diagnostic mammogram on 06/19/2021 showed a suspicious 1.1 cm mass in the right breast at 9 o'clock. Biopsy of the right breast on 06/21/2021 showed no evidence of atypia or malignancy. Left lumpectomy on 06/29/2021 showed a incidental 0.3 cm complex sclerosing lesion and no evidence of in situ or invasive carcinoma. She presents to the clinic today for initial evaluation and discussion of treatment options.   I reviewed her records extensively and collaborated the history with the patient.  MEDICAL HISTORY:  Past Medical History:  Diagnosis Date   Anxiety    Depression    Endometrial polyp 09/09/2014   Hypertension    Vaginal delivery 2005, 2008, 2010    SURGICAL HISTORY: Past Surgical History:  Procedure Laterality Date   BREAST LUMPECTOMY WITH RADIOACTIVE SEED LOCALIZATION Left 06/29/2021   Procedure: LEFT BREAST LUMPECTOMY WITH RADIOACTIVE SEED LOCALIZATION;  Surgeon: Jovita Kussmaul, MD;  Location: [facility];  Service: General;  Laterality: Left;   [place] N/A 10/14/2014    Procedure: DILATATION & CURETTAGE/HYSTEROSCOPY WITH MYOSURE;  Surgeon: Floyce Stakes. Pamala Hurry, MD;  Location: [facility];  Service: Gynecology;  Laterality: N/A;   NO PAST SURGERIES      SOCIAL HISTORY: Social History   Socioeconomic History   Marital status: Widowed    Spouse name: Not on file   Number of children: Not on file   Years of education: Not on file   Highest education level: Not on file  Occupational History   Not on file  Tobacco Use   Smoking status: Never   Smokeless tobacco: Never  Vaping Use   Vaping Use: Never used  Substance and Sexual Activity   Alcohol use: Yes    Alcohol/week: 0.0 standard drinks    Comment: occasionally   Drug use: No   Sexual activity: Yes  Other Topics Concern   Not on file  Social History Narrative   Husband committed suicide 12/2012   Lives with children (4yo, 6yo, 9yo)   Work's for [employer].   Enjoys reading, spending time with family.   Social Determinants of Health   Financial Resource Strain: Not on file  Food Insecurity: Not on file  Transportation Needs: Not on file  Physical Activity: Not on file  Stress: Not on file  Social Connections: Not on file  Intimate Partner Violence: Not on file    FAMILY HISTORY: Family History  Problem Relation Age of Onset   Cancer Father        metastatic lung (smoker)   Diabetes Father    Cancer Maternal Grandmother        breast, leukemia   Cancer Paternal Grandmother        ovarian or cervical  Hypertension Neg Hx    CAD Neg Hx    Stroke Neg Hx     ALLERGIES:  is allergic to sulfonamide derivatives.  MEDICATIONS:  Current Outpatient Medications  Medication Sig Dispense Refill   amLODipine-valsartan (EXFORGE) 5-160 MG tablet Take 1 tablet by mouth daily. 90 tablet 1   Calcium Carb-Cholecalciferol 500-200 MG-UNIT TABS Take 1 tablet by mouth daily.     FLUoxetine (PROZAC) 40 MG capsule Take 1 capsule (40 mg total) by mouth daily. 90 capsule 1   folic acid (FOLVITE) 1 MG tablet  Take 1 mg by mouth daily.     hydrochlorothiazide (HYDRODIURIL) 25 MG tablet Take 0.5 tablets (12.5 mg total) by mouth daily. 45 tablet 1   HYDROcodone-acetaminophen (NORCO/VICODIN) 5-325 MG tablet Take 1 tablet by mouth every 6 (six) hours as needed for moderate pain or severe pain. 10 tablet 0   vitamin B-12 (CYANOCOBALAMIN) 1000 MCG tablet Take 1,000 mcg by mouth daily.     No current facility-administered medications for this visit.    REVIEW OF SYSTEMS:   Constitutional: Denies fevers, chills or abnormal night sweats Eyes: Denies blurriness of vision, double vision or watery eyes Ears, nose, mouth, throat, and face: Denies mucositis or sore throat Respiratory: Denies cough, dyspnea or wheezes Cardiovascular: Denies palpitation, chest discomfort or lower extremity swelling Gastrointestinal:  Denies nausea, heartburn or change in bowel habits Skin: Denies abnormal skin rashes Lymphatics: Denies new lymphadenopathy or easy bruising Neurological:Denies numbness, tingling or new weaknesses Behavioral/Psych: Mood is stable, no new changes    All other systems were reviewed with the patient and are negative.  PHYSICAL EXAMINATION: ECOG PERFORMANCE STATUS: 0 - Asymptomatic  Vitals:   08/01/21 1316  BP: (!) 153/84  Pulse: (!) 120  Resp: 18  Temp: (!) 97.3 F (36.3 C)  SpO2: 98%   Filed Weights   08/01/21 1316  Weight: 254 lb 11.2 oz (115.5 kg)      LABORATORY DATA:  I have reviewed the data as listed Lab Results  Component Value Date   WBC CANCELED 06/29/2020   HGB 12.5 03/02/2018   HCT 36.7 03/02/2018   MCV 86 03/02/2018   PLT 281 03/02/2018   Lab Results  Component Value Date   NA 139 06/25/2021   K 4.3 06/25/2021   CL 104 06/25/2021   CO2 26 06/25/2021    RADIOGRAPHIC STUDIES: I have personally reviewed the radiological reports and agreed with the findings in the report.  ASSESSMENT AND PLAN:  Complex sclerosing lesion of left breast 06/29/2021: Left  lumpectomy: Fibrocystic change with adenosis, U DH,PASH, apocrine metaplasia, CSL 0.3 cm Counseling: Discussed the diagnosis in extensive detail 10-year risk of recurrence: 5.2% average woman's risk 2.6% Lifetime risk 18% average woman's risk 9.6%  Risk reduction: We discussed pharmacological and nonpharmacological risk reduction measures. I did not recommend tamoxifen   We also discussed exercise, diet, healthy eating habits, reducing alcohol consumption, and role of supplements in reduction of breast cancer risk.    Breast cancer surveillance: Annual mammograms Genetic counseling and testing is recommended   Return to clinic on an as-needed basis   All questions were answered. The patient knows to call the clinic with any problems, questions or concerns.   Rulon Eisenmenger, MD, MPH 08/01/2021    I, Thana Ates, am acting as scribe for Nicholas Lose, MD.  I have reviewed the above documentation for accuracy and completeness, and I agree with the above.

## 2021-08-01 ENCOUNTER — Inpatient Hospital Stay: Payer: Managed Care, Other (non HMO) | Attending: Hematology and Oncology | Admitting: Hematology and Oncology

## 2021-08-01 DIAGNOSIS — Z8049 Family history of malignant neoplasm of other genital organs: Secondary | ICD-10-CM | POA: Insufficient documentation

## 2021-08-01 DIAGNOSIS — Z801 Family history of malignant neoplasm of trachea, bronchus and lung: Secondary | ICD-10-CM | POA: Diagnosis not present

## 2021-08-01 DIAGNOSIS — Z79899 Other long term (current) drug therapy: Secondary | ICD-10-CM | POA: Diagnosis not present

## 2021-08-01 DIAGNOSIS — N6022 Fibroadenosis of left breast: Secondary | ICD-10-CM | POA: Insufficient documentation

## 2021-08-01 DIAGNOSIS — Z803 Family history of malignant neoplasm of breast: Secondary | ICD-10-CM | POA: Diagnosis not present

## 2021-08-01 DIAGNOSIS — N6315 Unspecified lump in the right breast, overlapping quadrants: Secondary | ICD-10-CM | POA: Diagnosis not present

## 2021-08-01 DIAGNOSIS — Z1501 Genetic susceptibility to malignant neoplasm of breast: Secondary | ICD-10-CM | POA: Diagnosis present

## 2021-08-01 DIAGNOSIS — I10 Essential (primary) hypertension: Secondary | ICD-10-CM | POA: Diagnosis not present

## 2021-08-01 DIAGNOSIS — Z882 Allergy status to sulfonamides status: Secondary | ICD-10-CM | POA: Diagnosis not present

## 2021-08-01 DIAGNOSIS — N6489 Other specified disorders of breast: Secondary | ICD-10-CM | POA: Diagnosis not present

## 2021-08-01 DIAGNOSIS — N6082 Other benign mammary dysplasias of left breast: Secondary | ICD-10-CM | POA: Insufficient documentation

## 2021-08-01 DIAGNOSIS — Z833 Family history of diabetes mellitus: Secondary | ICD-10-CM | POA: Insufficient documentation

## 2021-08-01 NOTE — Assessment & Plan Note (Signed)
06/29/2021: Left lumpectomy: Fibrocystic change with adenosis, U DH,PASH, apocrine metaplasia, CSL 0.3 cm Counseling: Discussed the diagnosis in extensive detail  Risk reduction: We discussed pharmacological and nonpharmacological risk reduction measures. We discussed the pros and cons of tamoxifen in reducing her risk. We also discussed exercise, diet, healthy eating habits, reducing alcohol consumption, and role of supplements in reduction of breast cancer risk. Tamoxifen counseling: We discussed the risks and benefits of tamoxifen. These include but not limited to insomnia, hot flashes, mood changes, vaginal dryness, and weight gain. Although rare, serious side effects including endometrial cancer, risk of blood clots were also discussed. We strongly believe that the benefits far outweigh the risks. Patient understands these risks and consented to starting treatment. Planned treatment duration is 5 years.   Breast cancer surveillance: 1.  Annual mammograms 2.  Annual breast MRIs

## 2021-08-20 ENCOUNTER — Inpatient Hospital Stay: Payer: Managed Care, Other (non HMO)

## 2021-08-20 ENCOUNTER — Inpatient Hospital Stay: Payer: Managed Care, Other (non HMO) | Admitting: Genetic Counselor

## 2021-11-01 ENCOUNTER — Encounter (HOSPITAL_COMMUNITY): Payer: Self-pay

## 2022-01-15 ENCOUNTER — Other Ambulatory Visit: Payer: Self-pay | Admitting: Physician Assistant

## 2022-01-15 DIAGNOSIS — I10 Essential (primary) hypertension: Secondary | ICD-10-CM

## 2022-01-15 DIAGNOSIS — F341 Dysthymic disorder: Secondary | ICD-10-CM

## 2022-01-15 DIAGNOSIS — Z6379 Other stressful life events affecting family and household: Secondary | ICD-10-CM

## 2022-01-15 NOTE — Telephone Encounter (Signed)
NEEDS APT FOR REFILLS ?

## 2022-03-06 ENCOUNTER — Other Ambulatory Visit: Payer: Self-pay | Admitting: Physician Assistant

## 2022-03-06 DIAGNOSIS — Z6379 Other stressful life events affecting family and household: Secondary | ICD-10-CM

## 2022-03-06 DIAGNOSIS — I10 Essential (primary) hypertension: Secondary | ICD-10-CM

## 2022-03-06 DIAGNOSIS — F341 Dysthymic disorder: Secondary | ICD-10-CM

## 2022-03-14 NOTE — Patient Instructions (Signed)
Preventive Care 40-50 Years Old, Female Preventive care refers to lifestyle choices and visits with your health care provider that can promote health and wellness. Preventive care visits are also called wellness exams. What can I expect for my preventive care visit? Counseling Your health care provider may ask you questions about your: Medical history, including: Past medical problems. Family medical history. Pregnancy history. Current health, including: Menstrual cycle. Method of birth control. Emotional well-being. Home life and relationship well-being. Sexual activity and sexual health. Lifestyle, including: Alcohol, nicotine or tobacco, and drug use. Access to firearms. Diet, exercise, and sleep habits. Work and work environment. Sunscreen use. Safety issues such as seatbelt and bike helmet use. Physical exam Your health care provider will check your: Height and weight. These may be used to calculate your BMI (body mass index). BMI is a measurement that tells if you are at a healthy weight. Waist circumference. This measures the distance around your waistline. This measurement also tells if you are at a healthy weight and may help predict your risk of certain diseases, such as type 2 diabetes and high blood pressure. Heart rate and blood pressure. Body temperature. Skin for abnormal spots. What immunizations do I need?  Vaccines are usually given at various ages, according to a schedule. Your health care provider will recommend vaccines for you based on your age, medical history, and lifestyle or other factors, such as travel or where you work. What tests do I need? Screening Your health care provider may recommend screening tests for certain conditions. This may include: Lipid and cholesterol levels. Diabetes screening. This is done by checking your blood sugar (glucose) after you have not eaten for a while (fasting). Pelvic exam and Pap test. Hepatitis B test. Hepatitis C  test. HIV (human immunodeficiency virus) test. STI (sexually transmitted infection) testing, if you are at risk. Lung cancer screening. Colorectal cancer screening. Mammogram. Talk with your health care provider about when you should start having regular mammograms. This may depend on whether you have a family history of breast cancer. BRCA-related cancer screening. This may be done if you have a family history of breast, ovarian, tubal, or peritoneal cancers. Bone density scan. This is done to screen for osteoporosis. Talk with your health care provider about your test results, treatment options, and if necessary, the need for more tests. Follow these instructions at home: Eating and drinking  Eat a diet that includes fresh fruits and vegetables, whole grains, lean protein, and low-fat dairy products. Take vitamin and mineral supplements as recommended by your health care provider. Do not drink alcohol if: Your health care provider tells you not to drink. You are pregnant, may be pregnant, or are planning to become pregnant. If you drink alcohol: Limit how much you have to 0-1 drink a day. Know how much alcohol is in your drink. In the [place] one drink equals one 12 oz bottle of beer (355 mL), one 5 oz glass of wine (148 mL), or one 1 oz glass of hard liquor (44 mL). Lifestyle Brush your teeth every morning and night with fluoride toothpaste. Floss one time each day. Exercise for at least 30 minutes 5 or more days each week. Do not use any products that contain nicotine or tobacco. These products include cigarettes, chewing tobacco, and vaping devices, such as e-cigarettes. If you need help quitting, ask your health care provider. Do not use drugs. If you are sexually active, practice safe sex. Use a condom or other form of protection to   prevent STIs. If you do not wish to become pregnant, use a form of birth control. If you plan to become pregnant, see your health care provider for a  prepregnancy visit. Take aspirin only as told by your health care provider. Make sure that you understand how much to take and what form to take. Work with your health care provider to find out whether it is safe and beneficial for you to take aspirin daily. Find healthy ways to manage stress, such as: Meditation, yoga, or listening to music. Journaling. Talking to a trusted person. Spending time with friends and family. Minimize exposure to UV radiation to reduce your risk of skin cancer. Safety Always wear your seat belt while driving or riding in a vehicle. Do not drive: If you have been drinking alcohol. Do not ride with someone who has been drinking. When you are tired or distracted. While texting. If you have been using any mind-altering substances or drugs. Wear a helmet and other protective equipment during sports activities. If you have firearms in your house, make sure you follow all gun safety procedures. Seek help if you have been physically or sexually abused. What's next? Visit your health care provider once a year for an annual wellness visit. Ask your health care provider how often you should have your eyes and teeth checked. Stay up to date on all vaccines. This information is not intended to replace advice given to you by your health care provider. Make sure you discuss any questions you have with your health care provider. Document Revised: 02/21/2021 Document Reviewed: 02/21/2021 Elsevier Patient Education  [place].

## 2022-03-20 ENCOUNTER — Ambulatory Visit (INDEPENDENT_AMBULATORY_CARE_PROVIDER_SITE_OTHER): Payer: Managed Care, Other (non HMO) | Admitting: Physician Assistant

## 2022-03-20 ENCOUNTER — Encounter: Payer: Self-pay | Admitting: Physician Assistant

## 2022-03-20 VITALS — BP 126/77 | HR 68 | Temp 97.7°F | Ht 67.0 in | Wt 258.0 lb

## 2022-03-20 DIAGNOSIS — I10 Essential (primary) hypertension: Secondary | ICD-10-CM | POA: Diagnosis not present

## 2022-03-20 DIAGNOSIS — Z0000 Encounter for general adult medical examination without abnormal findings: Secondary | ICD-10-CM

## 2022-03-20 DIAGNOSIS — Z1211 Encounter for screening for malignant neoplasm of colon: Secondary | ICD-10-CM | POA: Diagnosis not present

## 2022-03-20 DIAGNOSIS — F341 Dysthymic disorder: Secondary | ICD-10-CM | POA: Diagnosis not present

## 2022-03-20 DIAGNOSIS — Z6379 Other stressful life events affecting family and household: Secondary | ICD-10-CM

## 2022-03-20 MED ORDER — FLUOXETINE HCL 40 MG PO CAPS: 40.0000 mg | ORAL_CAPSULE | Freq: Every day | ORAL | 1 refills | Status: DC

## 2022-03-20 MED ORDER — AMLODIPINE BESYLATE-VALSARTAN 5-160 MG PO TABS: 1.0000 | ORAL_TABLET | Freq: Every day | ORAL | 1 refills | Status: DC

## 2022-03-20 MED ORDER — HYDROCHLOROTHIAZIDE 25 MG PO TABS: 12.5000 mg | ORAL_TABLET | Freq: Every day | ORAL | 1 refills | Status: DC

## 2022-03-20 NOTE — Progress Notes (Signed)
Complete physical exam   Patient: Sheri Cole   DOB: [birth year]   51 y.o. Female  MRN: [id number] Visit Date: 03/20/2022   Chief Complaint  Patient presents with   Annual Exam   Subjective    Sheri Cole is a 51 y.o. female who presents today for a complete physical exam.  She reports consuming a general diet.  Tries to stay as active as possible, currently renovating her house.  She generally feels fairly well. She does not have additional problems to discuss today.     Past Medical History:  Diagnosis Date   Anxiety    Depression    Endometrial polyp 09/09/2014   Hypertension    Vaginal delivery 2005, 2008, 2010   Past Surgical History:  Procedure Laterality Date   BREAST LUMPECTOMY WITH RADIOACTIVE SEED LOCALIZATION Left 06/29/2021   Procedure: LEFT BREAST LUMPECTOMY WITH RADIOACTIVE SEED LOCALIZATION;  Surgeon: Jovita Kussmaul, MD;  Location: [facility];  Service: General;  Laterality: Left;   [employer] N/A 10/14/2014   Procedure: DILATATION & CURETTAGE/HYSTEROSCOPY WITH MYOSURE;  Surgeon: Floyce Stakes. Pamala Hurry, MD;  Location: [facility];  Service: Gynecology;  Laterality: N/A;   NO PAST SURGERIES     Social History   Socioeconomic History   Marital status: Widowed    Spouse name: Not on file   Number of children: Not on file   Years of education: Not on file   Highest education level: Not on file  Occupational History   Not on file  Tobacco Use   Smoking status: Never   Smokeless tobacco: Never  Vaping Use   Vaping Use: Never used  Substance and Sexual Activity   Alcohol use: Yes    Alcohol/week: 0.0 standard drinks of alcohol    Comment: occasionally   Drug use: No   Sexual activity: Yes  Other Topics Concern   Not on file  Social History Narrative   Husband committed suicide 12/2012   Lives with children (4yo, 6yo, 101yo)   Work's for [employer].   Enjoys reading, spending time with family.    Social Determinants of Health   Financial Resource Strain: Not on file  Food Insecurity: Not on file  Transportation Needs: Not on file  Physical Activity: Not on file  Stress: Not on file  Social Connections: Not on file  Intimate Partner Violence: Not on file     Medications: Outpatient Medications Prior to Visit  Medication Sig   Calcium Carb-Cholecalciferol 500-200 MG-UNIT TABS Take 1 tablet by mouth daily.   folic acid (FOLVITE) 1 MG tablet Take 1 mg by mouth daily.   HYDROcodone-acetaminophen (NORCO/VICODIN) 5-325 MG tablet Take 1 tablet by mouth every 6 (six) hours as needed for moderate pain or severe pain.   vitamin B-12 (CYANOCOBALAMIN) 1000 MCG tablet Take 1,000 mcg by mouth daily.   [DISCONTINUED] amLODipine-valsartan (EXFORGE) 5-160 MG tablet TAKE 1 TABLET DAILY   [DISCONTINUED] FLUoxetine (PROZAC) 40 MG capsule Take 1 capsule (40 mg total) by mouth daily.   [DISCONTINUED] hydrochlorothiazide (HYDRODIURIL) 25 MG tablet TAKE ONE-HALF (1/2) TABLET DAILY   No facility-administered medications prior to visit.    Review of Systems Review of Systems:  A fourteen system review of systems was performed and found to be positive as per HPI.  Last CBC Lab Results  Component Value Date   WBC CANCELED 06/29/2020   HGB 12.5 03/02/2018   HCT 36.7 03/02/2018   MCV 86 03/02/2018  MCH 29.4 03/02/2018   RDW 14.1 03/02/2018   PLT 281 46/96/2952   Last metabolic panel Lab Results  Component Value Date   GLUCOSE 103 (H) 06/25/2021   NA 139 06/25/2021   K 4.3 06/25/2021   CL 104 06/25/2021   CO2 26 06/25/2021   BUN 11 06/25/2021   CREATININE 0.74 06/25/2021   GFRNONAA >60 06/25/2021   CALCIUM 9.1 06/25/2021   PROT 6.9 06/29/2020   ALBUMIN 4.4 06/29/2020   LABGLOB 2.5 06/29/2020   AGRATIO 1.8 06/29/2020   BILITOT 0.4 06/29/2020   ALKPHOS 40 (L) 06/29/2020   AST 21 06/29/2020   ALT 22 06/29/2020   ANIONGAP 9 06/25/2021   Last lipids Lab Results  Component  Value Date   CHOL 230 (H) 06/29/2020   HDL 59 06/29/2020   LDLCALC 146 (H) 06/29/2020   TRIG 142 06/29/2020   CHOLHDL 3.9 06/29/2020   Last hemoglobin A1c Lab Results  Component Value Date   HGBA1C CANCELED 06/29/2020   Last thyroid functions Lab Results  Component Value Date   TSH 1.670 06/29/2020   Last vitamin D Lab Results  Component Value Date   VD25OH 54.1 03/02/2018    Objective     BP 126/77   Pulse 68   Temp 97.7 F (36.5 C)   Ht '5\' 7"'$  (1.702 m)   Wt 258 lb (117 kg)   SpO2 98%   BMI 40.41 kg/m  BP Readings from Last 3 Encounters:  03/20/22 126/77  08/01/21 (!) 153/84  06/29/21 138/74   Wt Readings from Last 3 Encounters:  03/20/22 258 lb (117 kg)  08/01/21 254 lb 11.2 oz (115.5 kg)  06/29/21 249 lb 1.9 oz (113 kg)    Physical Exam   General Appearance:     Alert, cooperative, in no acute distress, appears stated age   Head:    Normocephalic, without obvious abnormality, atraumatic  Eyes:    PERRL, conjunctiva/corneas clear, EOM's intact, fundi    benign, both eyes  Ears:    Normal TM's and external ear canals, both ears  Nose:   Nares normal, septum midline, mucosa normal, no drainage    or sinus tenderness  Throat:   Lips, mucosa, and tongue normal; teeth and gums normal  Neck:   Supple, symmetrical, trachea midline, no adenopathy;    thyroid:  no enlargement/tenderness/nodules; no  JVD  Back:     Symmetric, no curvature, ROM normal, no CVA tenderness  Lungs:     Clear to auscultation bilaterally, respirations unlabored  Chest Wall:    No tenderness or deformity   Heart:    Normal heart rate. Normal rhythm. No murmurs, rubs, or gallops.   Breast Exam:    deferred  Abdomen:     Soft, non-tender, bowel sounds active all four quadrants,    no masses, no organomegaly  Pelvic:    deferred  Extremities:   All extremities are intact. No cyanosis or edema  Pulses:   2+ and symmetric all extremities  Skin:   Skin color, texture, turgor normal,  suspicious facial lesion (right cheek)  Lymph nodes:   Cervical and supraclavicular nodes normal  Neurologic:   CNII-XII grossly intact.      Last depression screening scores    03/20/2022    2:36 PM 04/25/2021    2:49 PM 06/27/2020   11:29 AM  PHQ 2/9 Scores  PHQ - 2 Score 2 0 0  PHQ- 9 Score '5 3 1   '$ Last fall risk  screening    03/20/2022    2:36 PM  [place] in the past year? 0  Number falls in past yr: 0  Injury with Fall? 0  Risk for fall due to : No Fall Risks  Follow up Falls evaluation completed     No results found for any visits on 03/20/22.  Assessment & Plan    Routine Health Maintenance and Physical Exam  Exercise Activities and Dietary recommendations -A heart healthy diet low in fat and carbohydrates.   Immunization History  Administered Date(s) Administered   PFIZER Comirnaty(Gray Top)Covid-19 Tri-Sucrose Vaccine 07/17/2020, 08/07/2020   Tdap 06/23/2012    Health Maintenance  Topic Date Due   COLONOSCOPY (Pts 45-24yr Insurance coverage will need to be confirmed)  Never done   COVID-19 Vaccine (3 - Pfizer series) 10/02/2020   INFLUENZA VACCINE  04/09/2022   TETANUS/TDAP  06/23/2022   MAMMOGRAM  04/19/2023   PAP SMEAR-Modifier  04/18/2024   Hepatitis C Screening  Completed   HIV Screening  Completed   HPV VACCINES  Aged Out    Discussed health benefits of physical activity, and encouraged her to engage in regular exercise appropriate for her age and condition.  Problem List Items Addressed This Visit       Cardiovascular and Mediastinum   Essential hypertension   Relevant Medications   amLODipine-valsartan (EXFORGE) 5-160 MG tablet   hydrochlorothiazide (HYDRODIURIL) 25 MG tablet     Other   Stress in life due to family/ household (Chronic)   Relevant Medications   FLUoxetine (PROZAC) 40 MG capsule   Dysthymia   Relevant Medications   FLUoxetine (PROZAC) 40 MG capsule   Other Visit Diagnoses     Healthcare maintenance     -  Primary   Screening for colon cancer       Relevant Orders   Ambulatory referral to Gastroenterology      Advised patient to schedule lab visit for FBW. Patient agreeable to colonoscopy. Patient plans to schedule female exam with her OB/GYN. Recommend to follow-up with oncology as advised. BP stable. Provided medication refills.   Return in about 6 months (around 09/20/2022) for Mood, HTN; lab visit in 1-4 weeks for FBW.       MLorrene Reid PA-C  [phone](phone) [phone](fax)  [place]

## 2022-03-21 ENCOUNTER — Other Ambulatory Visit: Payer: Managed Care, Other (non HMO)

## 2022-03-21 DIAGNOSIS — I10 Essential (primary) hypertension: Secondary | ICD-10-CM

## 2022-03-21 DIAGNOSIS — Z8639 Personal history of other endocrine, nutritional and metabolic disease: Secondary | ICD-10-CM

## 2022-03-21 DIAGNOSIS — Z0000 Encounter for general adult medical examination without abnormal findings: Secondary | ICD-10-CM

## 2022-03-22 LAB — LIPID PANEL
Chol/HDL Ratio: 5.1 ratio — ABNORMAL HIGH (ref 0.0–4.4)
Cholesterol, Total: 224 mg/dL — ABNORMAL HIGH (ref 100–199)
HDL: 44 mg/dL (ref >39)
LDL Chol Calc (NIH): 157 mg/dL — ABNORMAL HIGH (ref 0–99)
Triglycerides: 126 mg/dL (ref 0–149)
VLDL Cholesterol Cal: 23 mg/dL (ref 5–40)

## 2022-03-22 LAB — HEMOGLOBIN A1C
Est. average glucose Bld gHb Est-mCnc: 108 mg/dL
Hgb A1c MFr Bld: 5.4 % (ref 4.8–5.6)

## 2022-03-22 LAB — CBC
Hematocrit: 38 % (ref 34.0–46.6)
Hemoglobin: 13.1 g/dL (ref 11.1–15.9)
MCH: 31 pg (ref 26.6–33.0)
MCHC: 34.5 g/dL (ref 31.5–35.7)
MCV: 90 fL (ref 79–97)
Platelets: 284 10*3/uL (ref 150–450)
RBC: 4.23 x10E6/uL (ref 3.77–5.28)
RDW: 13.2 % (ref 11.7–15.4)
WBC: 6.5 10*3/uL (ref 3.4–10.8)

## 2022-03-22 LAB — COMPREHENSIVE METABOLIC PANEL
ALT: 31 IU/L (ref 0–32)
AST: 20 IU/L (ref 0–40)
Albumin/Globulin Ratio: 1.6 (ref 1.2–2.2)
Albumin: 4 g/dL (ref 3.9–4.9)
Alkaline Phosphatase: 41 IU/L — ABNORMAL LOW (ref 44–121)
BUN/Creatinine Ratio: 20 (ref 9–23)
BUN: 13 mg/dL (ref 6–24)
Bilirubin Total: 0.4 mg/dL (ref 0.0–1.2)
CO2: 21 mmol/L (ref 20–29)
Calcium: 9.4 mg/dL (ref 8.7–10.2)
Chloride: 100 mmol/L (ref 96–106)
Creatinine, Ser: 0.66 mg/dL (ref 0.57–1.00)
Globulin, Total: 2.5 g/dL (ref 1.5–4.5)
Glucose: 92 mg/dL (ref 70–99)
Potassium: 4.5 mmol/L (ref 3.5–5.2)
Sodium: 139 mmol/L (ref 134–144)
Total Protein: 6.5 g/dL (ref 6.0–8.5)
eGFR: 107 mL/min/{1.73_m2} (ref >59)

## 2022-03-22 LAB — TSH: TSH: 2.44 u[IU]/mL (ref 0.450–4.500)

## 2022-08-06 ENCOUNTER — Other Ambulatory Visit: Payer: Self-pay | Admitting: Obstetrics

## 2022-08-06 DIAGNOSIS — N644 Mastodynia: Secondary | ICD-10-CM

## 2022-08-09 ENCOUNTER — Ambulatory Visit (AMBULATORY_SURGERY_CENTER): Payer: Managed Care, Other (non HMO)

## 2022-08-09 ENCOUNTER — Other Ambulatory Visit: Payer: Self-pay

## 2022-08-09 VITALS — Ht 67.0 in | Wt 252.8 lb

## 2022-08-09 DIAGNOSIS — Z1211 Encounter for screening for malignant neoplasm of colon: Secondary | ICD-10-CM

## 2022-08-09 MED ORDER — NA SULFATE-K SULFATE-MG SULF 17.5-3.13-1.6 GM/177ML PO SOLN: 1.0000 | Freq: Once | ORAL | 0 refills | Status: AC

## 2022-08-09 NOTE — Progress Notes (Signed)
Denies allergies to eggs or soy products. Denies complication of anesthesia or sedation. Denies use of weight loss medication. Denies use of O2.   Emmi instructions given for colonoscopy.  

## 2022-08-15 ENCOUNTER — Ambulatory Visit
Admission: RE | Admit: 2022-08-15 | Discharge: 2022-08-15 | Disposition: A | Discharge status: Home / Self Care | Payer: Managed Care, Other (non HMO) | Source: Ambulatory Visit | Attending: Obstetrics | Admitting: Obstetrics

## 2022-08-15 DIAGNOSIS — N644 Mastodynia: Secondary | ICD-10-CM

## 2022-09-03 ENCOUNTER — Encounter: Payer: Self-pay | Admitting: Gastroenterology

## 2022-09-04 ENCOUNTER — Encounter: Payer: Self-pay | Admitting: Gastroenterology

## 2022-09-04 ENCOUNTER — Ambulatory Visit: Payer: Managed Care, Other (non HMO) | Admitting: Gastroenterology

## 2022-09-04 VITALS — BP 147/84 | HR 55 | Temp 98.4°F | Resp 10 | Ht 67.0 in | Wt 252.8 lb

## 2022-09-04 DIAGNOSIS — D124 Benign neoplasm of descending colon: Secondary | ICD-10-CM

## 2022-09-04 DIAGNOSIS — Z1211 Encounter for screening for malignant neoplasm of colon: Secondary | ICD-10-CM | POA: Diagnosis not present

## 2022-09-04 MED ORDER — SODIUM CHLORIDE 0.9 % IV SOLN: 500.0000 mL | Freq: Once | INTRAVENOUS | Status: DC

## 2022-09-04 NOTE — Progress Notes (Signed)
Referring Provider: Lorrene Reid, PA-C Primary Care Physician:  Lorrene Reid, PA-C  Indication for Colonoscopy:  Colon cancer screening   IMPRESSION:  Need for colon cancer screening Appropriate candidate for monitored anesthesia care  PLAN: Colonoscopy in the [place] today   HPI: Sheri Cole is a 51 y.o. female presents for screening colonoscopy.  No prior colonoscopy or colon cancer screening.  No known family history of colon cancer or polyps. No family history of uterine/endometrial cancer, pancreatic cancer or gastric/stomach cancer.   Past Medical History:  Diagnosis Date   Anxiety    Arthritis    Depression    Endometrial polyp 09/09/2014   Hypertension    Vaginal delivery 2005, 2008, 2010    Past Surgical History:  Procedure Laterality Date   BREAST LUMPECTOMY WITH RADIOACTIVE SEED LOCALIZATION Left 06/29/2021   Procedure: LEFT BREAST LUMPECTOMY WITH RADIOACTIVE SEED LOCALIZATION;  Surgeon: Jovita Kussmaul, MD;  Location: [facility];  Service: General;  Laterality: Left;   [place] N/A 10/14/2014   Procedure: DILATATION & CURETTAGE/HYSTEROSCOPY WITH MYOSURE;  Surgeon: Floyce Stakes. Pamala Hurry, MD;  Location: [facility];  Service: Gynecology;  Laterality: N/A;   NO PAST SURGERIES      Current Outpatient Medications  Medication Sig Dispense Refill   amLODipine-valsartan (EXFORGE) 5-160 MG tablet Take 1 tablet by mouth daily. 90 tablet 1   Calcium Carb-Cholecalciferol 500-200 MG-UNIT TABS Take 1 tablet by mouth daily.     FLUoxetine (PROZAC) 40 MG capsule Take 1 capsule (40 mg total) by mouth daily. 90 capsule 1   folic acid (FOLVITE) 1 MG tablet Take 1 mg by mouth daily.     hydrochlorothiazide (HYDRODIURIL) 25 MG tablet Take 0.5 tablets (12.5 mg total) by mouth daily. 90 tablet 1   vitamin B-12 (CYANOCOBALAMIN) 1000 MCG tablet Take 1,000 mcg by mouth daily.     Current Facility-Administered Medications   Medication Dose Route Frequency Provider Last Rate Last Admin   0.9 %  sodium chloride infusion  500 mL Intravenous Once Thornton Park, MD        Allergies as of 09/04/2022 - Review Complete 09/04/2022  Allergen Reaction Noted   Sulfonamide derivatives Hives     Family History  Problem Relation Age of Onset   Cancer Father        metastatic lung (smoker)   Diabetes Father    Cancer Maternal Grandmother        breast, leukemia   Cancer Paternal Grandmother        ovarian or cervical   Hypertension Neg Hx    CAD Neg Hx    Stroke Neg Hx    Colon cancer Neg Hx    Esophageal cancer Neg Hx    Rectal cancer Neg Hx    Stomach cancer Neg Hx      Physical Exam: General:   Alert,  well-nourished, pleasant and cooperative in NAD Head:  Normocephalic and atraumatic. Eyes:  Sclera clear, no icterus.   Conjunctiva pink. Mouth:  No deformity or lesions.   Neck:  Supple; no masses or thyromegaly. Lungs:  Clear throughout to auscultation.   No wheezes. Heart:  Regular rate and rhythm; no murmurs. Abdomen:  Soft, non-tender, nondistended, normal bowel sounds, no rebound or guarding.  Msk:  Symmetrical. No boney deformities LAD: No inguinal or umbilical LAD Extremities:  No clubbing or edema. Neurologic:  Alert and  oriented x4;  grossly nonfocal Skin:  No obvious rash or bruise. Psych:  Alert and cooperative. Normal mood and affect.     Studies/Results: No results found.    Hadrian Yarbrough L. Tarri Glenn, MD, MPH 09/04/2022, 10:06 AM

## 2022-09-04 NOTE — Progress Notes (Signed)
VS by [facility]   Pt's states no medical or surgical changes since previsit or office visit.

## 2022-09-04 NOTE — Patient Instructions (Signed)
Follow a high fiber diet. Resume previous medications. Awaiting pathology results. Repeat Colonoscopy date to be determined based on pathology results. Handouts provided ER:DEYCX polyps, Diverticulosis and High Fiber Diet.  YOU HAD AN ENDOSCOPIC PROCEDURE TODAY AT [facility]:   Refer to the procedure report that was given to you for any specific questions about what was found during the examination.  If the procedure report does not answer your questions, please call your gastroenterologist to clarify.  If you requested that your care partner not be given the details of your procedure findings, then the procedure report has been included in a sealed envelope for you to review at your convenience later.  YOU SHOULD EXPECT: Some feelings of bloating in the abdomen. Passage of more gas than usual.  Walking can help get rid of the air that was put into your GI tract during the procedure and reduce the bloating. If you had a lower endoscopy (such as a colonoscopy or flexible sigmoidoscopy) you may notice spotting of blood in your stool or on the toilet paper. If you underwent a bowel prep for your procedure, you may not have a normal bowel movement for a few days.  Please Note:  You might notice some irritation and congestion in your nose or some drainage.  This is from the oxygen used during your procedure.  There is no need for concern and it should clear up in a day or so.  SYMPTOMS TO REPORT IMMEDIATELY:  Following lower endoscopy (colonoscopy or flexible sigmoidoscopy):  Excessive amounts of blood in the stool  Significant tenderness or worsening of abdominal pains  Swelling of the abdomen that is new, acute  Fever of 100F or higher  For urgent or emergent issues, a gastroenterologist can be reached at any hour by calling [phone]. Do not use MyChart messaging for urgent concerns.    DIET:  We do recommend a small meal at first, but then you may proceed to your regular  diet.  Drink plenty of fluids but you should avoid alcoholic beverages for 24 hours.  ACTIVITY:  You should plan to take it easy for the rest of today and you should NOT DRIVE or use heavy machinery until tomorrow (because of the sedation medicines used during the test).    FOLLOW UP: Our staff will call the number listed on your records the next business day following your procedure.  We will call around 7:15- 8:00 am to check on you and address any questions or concerns that you may have regarding the information given to you following your procedure. If we do not reach you, we will leave a message.     If any biopsies were taken you will be contacted by phone or by letter within the next 1-3 weeks.  Please call us at [phone] if you have not heard about the biopsies in 3 weeks.    SIGNATURES/CONFIDENTIALITY: You and/or your care partner have signed paperwork which will be entered into your electronic medical record.  These signatures attest to the fact that that the information above on your After Visit Summary has been reviewed and is understood.  Full responsibility of the confidentiality of this discharge information lies with you and/or your care-partner.

## 2022-09-04 NOTE — Op Note (Addendum)
[facility] Patient Name: Sheri Cole Procedure Date: 09/04/2022 10:44 AM MRN: [id number] Endoscopist: Thornton Park MD, MD, [id number] Age: 51 Referring MD:  Date of Birth: 08-26-1971 Gender: Female Account #: [id number] Procedure:                Colonoscopy Indications:              Screening for colorectal malignant neoplasm, This                            is the patient's first colonoscopy                           No known family history of colon cancer or polyps Medicines:                Monitored Anesthesia Care Procedure:                Pre-Anesthesia Assessment:                           - Prior to the procedure, a History and Physical                            was performed, and patient medications and                            allergies were reviewed. The patient's tolerance of                            previous anesthesia was also reviewed. The risks                            and benefits of the procedure and the sedation                            options and risks were discussed with the patient.                            All questions were answered, and informed consent                            was obtained. Prior Anticoagulants: The patient has                            taken no anticoagulant or antiplatelet agents. ASA                            Grade Assessment: III - A patient with severe                            systemic disease. After reviewing the risks and                            benefits, the patient was deemed in satisfactory  condition to undergo the procedure.                           After obtaining informed consent, the colonoscope                            was passed under direct vision. Throughout the                            procedure, the patient's blood pressure, pulse, and                            oxygen saturations were monitored continuously. The                            CF [id number]  #[id number] was introduced through the anus                            and advanced to the the cecum, identified by                            appendiceal orifice and ileocecal valve. A second                            forward view of the right colon was performed. The                            colonoscopy was performed without difficulty. The                            patient tolerated the procedure well. The quality                            of the bowel preparation was good. The ileocecal                            valve, appendiceal orifice, and rectum were                            photographed. Scope In: 10:48:03 AM Scope Out: 11:02:27 AM Scope Withdrawal Time: 0 hours 9 minutes 27 seconds  Total Procedure Duration: 0 hours 14 minutes 24 seconds  Findings:                 The perianal and digital rectal examinations were                            normal.                           Medium-mouthed and small-mouthed diverticula were                            found in the sigmoid colon, descending colon and  ascending colon.                           A 3 mm polyp was found in the descending colon. The                            polyp was sessile. The polyp was removed with a                            cold snare. Resection and retrieval were complete.                            Estimated blood loss was minimal.                           The exam was otherwise without abnormality on                            direct and retroflexion views. Complications:            No immediate complications. Estimated Blood Loss:     Estimated blood loss was minimal. Impression:               - Diverticulosis in the sigmoid colon, descending                            colon, and ascending colon.                           - One 3 mm polyp in the descending colon, removed                            with a cold snare. Resected and retrieved.                           - The  examination was otherwise normal on direct                            and retroflexion views. Recommendation:           - Patient has a contact number available for                            emergencies. The signs and symptoms of potential                            delayed complications were discussed with the                            patient. Return to normal activities tomorrow.                            Written discharge instructions were provided to the  patient.                           - Continue present medications.                           - Await pathology results.                           - Repeat colonoscopy date to be determined after                            pending pathology results are reviewed for                            surveillance.                           - Follow a high fiber diet. Drink at least 64                            ounces of water daily. Add a daily stool bulking                            agent such as psyllium (an exampled would be                            Metamucil).                           - Emerging evidence supports eating a diet of                            fruits, vegetables, grains, calcium, and yogurt                            while reducing red meat and alcohol may reduce the                            risk of colon cancer.                           - Thank you for allowing me to be involved in your                            colon cancer prevention. Thornton Park MD, MD 09/04/2022 11:10:40 AM This report has been signed electronically.

## 2022-09-04 NOTE — Progress Notes (Signed)
Sedate, gd SR, tolerated procedure well, VSS, report to RN 

## 2022-09-04 NOTE — Progress Notes (Signed)
Called to room to assist during endoscopic procedure.  Patient ID and intended procedure confirmed with present staff. Received instructions for my participation in the procedure from the performing physician.  

## 2022-09-05 ENCOUNTER — Telehealth: Payer: Self-pay

## 2022-09-05 NOTE — Telephone Encounter (Signed)
Follow up call placed, message left

## 2022-09-07 ENCOUNTER — Encounter: Payer: Self-pay | Admitting: Gastroenterology

## 2022-09-20 ENCOUNTER — Ambulatory Visit: Payer: Managed Care, Other (non HMO) | Admitting: Physician Assistant

## 2022-11-05 ENCOUNTER — Ambulatory Visit: Payer: Managed Care, Other (non HMO) | Admitting: Family Medicine

## 2022-11-05 ENCOUNTER — Encounter (HOSPITAL_COMMUNITY): Payer: Self-pay

## 2022-11-05 ENCOUNTER — Encounter: Payer: Self-pay | Admitting: Family Medicine

## 2022-11-05 ENCOUNTER — Other Ambulatory Visit: Payer: Self-pay

## 2022-11-05 ENCOUNTER — Other Ambulatory Visit (HOSPITAL_COMMUNITY): Payer: Self-pay

## 2022-11-05 VITALS — BP 128/84 | HR 71 | Ht 67.0 in | Wt 247.0 lb

## 2022-11-05 DIAGNOSIS — I10 Essential (primary) hypertension: Secondary | ICD-10-CM

## 2022-11-05 DIAGNOSIS — F341 Dysthymic disorder: Secondary | ICD-10-CM

## 2022-11-05 DIAGNOSIS — Z8639 Personal history of other endocrine, nutritional and metabolic disease: Secondary | ICD-10-CM

## 2022-11-05 DIAGNOSIS — Z23 Encounter for immunization: Secondary | ICD-10-CM

## 2022-11-05 MED ORDER — WEGOVY 0.25 MG/0.5ML ~~LOC~~ SOAJ
0.2500 mg | SUBCUTANEOUS | 1 refills | Status: DC
  Filled 2022-11-05: qty 2, 28d supply, fill #0

## 2022-11-05 NOTE — Assessment & Plan Note (Addendum)
Stable.  Continue amlodipine-valsartan and hydrochlorothiazide.  Checking CBC, CMP, lipids today.  Will continue to monitor.

## 2022-11-05 NOTE — Assessment & Plan Note (Signed)
Mood is currently stable.  PHQ 9 score of 2 today, GAD-7 score of 0.  She does still experience some stress having 3 teenage daughters who do not like her current boyfriend, but the Prozac is helpful.  Continue Prozac 40 mg daily.  Will continue to monitor and adjust medication as needed.

## 2022-11-05 NOTE — Progress Notes (Signed)
Established Patient Office Visit  Subjective   Patient ID: Sheri Cole, female    DOB: 10/24/1970  Age: 52 y.o. MRN: [id number]  Chief Complaint  Patient presents with   Follow Up Mood   Hypertension    HPI Sheri Cole is a 52 y.o. female presenting today for follow up of dysthymia and hypertension. Mood: Patient is here to follow up for dysthymia. she is currently managing with Prozac 40 mg daily. Taking medication without side effects, reports excellent compliance with treatment. Denies mood changes or SI/HI. she feels mood is stable since last visit.     11/05/2022    8:22 AM 03/20/2022    2:36 PM 04/25/2021    2:49 PM  Depression screen PHQ 2/9  Decreased Interest 1 1 0  Down, Depressed, Hopeless 0 1 0  PHQ - 2 Score 1 2 0  Altered sleeping 0 1 2  Tired, decreased energy 0 1 1  Change in appetite 0 1 0  Feeling bad or failure about yourself  1 0 0  Trouble concentrating 0 0 0  Moving slowly or fidgety/restless 0 0 0  Suicidal thoughts 0 0 0  PHQ-9 Score '2 5 3  '$ Difficult doing work/chores Not difficult at all Not difficult at all Not difficult at all       11/05/2022    8:23 AM 03/20/2022    2:37 PM 04/25/2021    2:49 PM 03/17/2019   10:23 AM  GAD 7 : Generalized Anxiety Score  Nervous, Anxious, on Edge 0 0 0 0  Control/stop worrying 0 0 0 0  Worry too much - different things 0 0 0 0  Trouble relaxing 0 1 0 0  Restless 0 0 0 0  Easily annoyed or irritable 0 0 0 1  Afraid - awful might happen 0 0 0 0  Total GAD 7 Score 0 1 0 1  Anxiety Difficulty  Not difficult at all       Hypertension: Patient here for follow-up of elevated blood pressure. She is not exercising and is adherent to low salt diet.   Pt denies chest pain, SOB, dizziness, edema, syncope, fatigue or heart palpitations. Taking amlodipine-valsartan and hydrochlorothiazide, reports excellent compliance with treatment. Denies side effects.  Weight management: Patient would like to lose  weight and cites health as reasons for wanting to lose weight.. They have been following a low-carb diet.  She does not have a current exercise routine but would like to find something that fits into her schedule as her intent to do it during her lunch hour has not worked out. she has been making efforts for several months without success. Patient is interested in discussing 70.   ROS Negative unless otherwise noted in HPI   Objective:     BP 128/84   Pulse 71   Ht '5\' 7"'$  (1.702 m)   Wt 247 lb (112 kg)   LMP 10/13/2022 (Exact Date)   SpO2 96%   BMI 38.69 kg/m   Physical Exam Constitutional:      General: She is not in acute distress.    Appearance: Normal appearance.  HENT:     Head: Normocephalic and atraumatic.  Cardiovascular:     Rate and Rhythm: Normal rate and regular rhythm.     Pulses: Normal pulses.     Heart sounds: No murmur heard.    No friction rub. No gallop.  Pulmonary:     Effort: Pulmonary effort is normal. No respiratory  distress.     Breath sounds: No wheezing, rhonchi or rales.  Skin:    General: Skin is warm and dry.  Neurological:     Mental Status: She is alert and oriented to person, place, and time.   The 10-year ASCVD risk score (Arnett DK, et al., 2019) is: 2.6%    Assessment & Plan:  Essential hypertension Assessment & Plan: Stable.  Continue amlodipine-valsartan and hydrochlorothiazide.  Checking CBC, CMP, lipids today.  Will continue to monitor.  Orders: -     CBC with Differential/Platelet; Future -     Comprehensive metabolic panel; Future -     Lipid panel; Future  Dysthymia Assessment & Plan: Mood is currently stable.  PHQ 9 score of 2 today, GAD-7 score of 0.  She does still experience some stress having 3 teenage daughters who do not like her current boyfriend, but the Prozac is helpful.  Continue Prozac 40 mg daily.  Will continue to monitor and adjust medication as needed.  Orders: -     CBC with Differential/Platelet;  Future -     Comprehensive metabolic panel; Future  History of hyperlipidemia Assessment & Plan: Checking lipid panel today.  If necessary, will discuss starting medication at next visit.  Otherwise continue with lifestyle changes.  Orders: -     Lipid panel; Future  Need for Tdap vaccination -     Tdap vaccine greater than or equal to 7yo IM  Class 2 severe obesity with serious comorbidity and body mass index (BMI) of 35.0 to 35.9 in adult, unspecified obesity type [facility]) Assessment & Plan: Interested in losing weight.  We discussed medication options including GLP-1's and their mechanism of action, contraindications, possible side effects, and how to use them.  She would like to try [facility].  Sending into [facility] pharmacy at [place] long for her to pick up.  We will follow-up in 6 to 8 weeks to see how things are going.  Orders: -     Wegovy; Inject 0.25 mg into the skin once a week.  Dispense: 2 mL; Refill: 1  Return in about 6 weeks (around 12/17/2022) for follow-up for [facility].    Sheri Harman, PA

## 2022-11-05 NOTE — Assessment & Plan Note (Signed)
Interested in losing weight.  We discussed medication options including GLP-1's and their mechanism of action, contraindications, possible side effects, and how to use them.  She would like to try [facility].  Sending into [facility] pharmacy at [place] long for her to pick up.  We will follow-up in 6 to 8 weeks to see how things are going.

## 2022-11-05 NOTE — Patient Instructions (Signed)
Podcast recommendation: Antiaging unraveled with Jasmine December DO

## 2022-11-05 NOTE — Assessment & Plan Note (Signed)
Checking lipid panel today.  If necessary, will discuss starting medication at next visit.  Otherwise continue with lifestyle changes.

## 2022-11-06 ENCOUNTER — Encounter: Payer: Self-pay | Admitting: Family Medicine

## 2022-11-06 LAB — CBC WITH DIFFERENTIAL/PLATELET
Basophils Absolute: 0 10*3/uL (ref 0.0–0.2)
Basos: 1 %
EOS (ABSOLUTE): 0.1 10*3/uL (ref 0.0–0.4)
Eos: 2 %
Hematocrit: 40.7 % (ref 34.0–46.6)
Hemoglobin: 13.6 g/dL (ref 11.1–15.9)
Immature Grans (Abs): 0 10*3/uL (ref 0.0–0.1)
Immature Granulocytes: 0 %
Lymphocytes Absolute: 1.7 10*3/uL (ref 0.7–3.1)
Lymphs: 30 %
MCH: 29.7 pg (ref 26.6–33.0)
MCHC: 33.4 g/dL (ref 31.5–35.7)
MCV: 89 fL (ref 79–97)
Monocytes Absolute: 0.5 10*3/uL (ref 0.1–0.9)
Monocytes: 8 %
Neutrophils Absolute: 3.2 10*3/uL (ref 1.4–7.0)
Neutrophils: 59 %
Platelets: 285 10*3/uL (ref 150–450)
RBC: 4.58 x10E6/uL (ref 3.77–5.28)
RDW: 13.2 % (ref 11.7–15.4)
WBC: 5.4 10*3/uL (ref 3.4–10.8)

## 2022-11-06 LAB — COMPREHENSIVE METABOLIC PANEL
ALT: 19 IU/L (ref 0–32)
AST: 15 IU/L (ref 0–40)
Albumin/Globulin Ratio: 2 (ref 1.2–2.2)
Albumin: 4.3 g/dL (ref 3.9–4.9)
Alkaline Phosphatase: 38 IU/L — ABNORMAL LOW (ref 44–121)
BUN/Creatinine Ratio: 16 (ref 9–23)
BUN: 13 mg/dL (ref 6–24)
Bilirubin Total: 0.3 mg/dL (ref 0.0–1.2)
CO2: 23 mmol/L (ref 20–29)
Calcium: 9.4 mg/dL (ref 8.7–10.2)
Chloride: 101 mmol/L (ref 96–106)
Creatinine, Ser: 0.82 mg/dL (ref 0.57–1.00)
Globulin, Total: 2.2 g/dL (ref 1.5–4.5)
Glucose: 107 mg/dL — ABNORMAL HIGH (ref 70–99)
Potassium: 4.6 mmol/L (ref 3.5–5.2)
Sodium: 139 mmol/L (ref 134–144)
Total Protein: 6.5 g/dL (ref 6.0–8.5)
eGFR: 87 mL/min/{1.73_m2} (ref >59)

## 2022-11-06 LAB — LIPID PANEL
Chol/HDL Ratio: 4.6 ratio — ABNORMAL HIGH (ref 0.0–4.4)
Cholesterol, Total: 230 mg/dL — ABNORMAL HIGH (ref 100–199)
HDL: 50 mg/dL (ref >39)
LDL Chol Calc (NIH): 163 mg/dL — ABNORMAL HIGH (ref 0–99)
Triglycerides: 95 mg/dL (ref 0–149)
VLDL Cholesterol Cal: 17 mg/dL (ref 5–40)

## 2022-12-24 ENCOUNTER — Ambulatory Visit: Payer: Managed Care, Other (non HMO) | Admitting: Family Medicine

## 2023-02-10 ENCOUNTER — Other Ambulatory Visit: Payer: Self-pay

## 2023-02-10 DIAGNOSIS — I10 Essential (primary) hypertension: Secondary | ICD-10-CM

## 2023-02-10 DIAGNOSIS — Z6379 Other stressful life events affecting family and household: Secondary | ICD-10-CM

## 2023-02-10 DIAGNOSIS — F341 Dysthymic disorder: Secondary | ICD-10-CM

## 2023-02-10 MED ORDER — FLUOXETINE HCL 40 MG PO CAPS
40.0000 mg | ORAL_CAPSULE | Freq: Every day | ORAL | 1 refills | Status: DC
Start: 2023-02-10 — End: 2023-09-08

## 2023-02-10 MED ORDER — AMLODIPINE BESYLATE-VALSARTAN 5-160 MG PO TABS
1.0000 | ORAL_TABLET | Freq: Every day | ORAL | 1 refills | Status: DC
Start: 2023-02-10 — End: 2023-09-08

## 2023-03-12 LAB — HM MAMMOGRAPHY

## 2023-03-14 ENCOUNTER — Encounter: Payer: Self-pay | Admitting: Family Medicine

## 2023-04-16 ENCOUNTER — Other Ambulatory Visit: Payer: Self-pay | Admitting: Family Medicine

## 2023-04-16 DIAGNOSIS — Z0000 Encounter for general adult medical examination without abnormal findings: Secondary | ICD-10-CM

## 2023-04-16 DIAGNOSIS — F341 Dysthymic disorder: Secondary | ICD-10-CM

## 2023-04-16 DIAGNOSIS — I10 Essential (primary) hypertension: Secondary | ICD-10-CM

## 2023-04-29 ENCOUNTER — Other Ambulatory Visit: Payer: Managed Care, Other (non HMO)

## 2023-04-29 DIAGNOSIS — Z0000 Encounter for general adult medical examination without abnormal findings: Secondary | ICD-10-CM

## 2023-04-29 DIAGNOSIS — I10 Essential (primary) hypertension: Secondary | ICD-10-CM

## 2023-04-30 LAB — LIPID PANEL
Chol/HDL Ratio: 5.3 ratio — ABNORMAL HIGH (ref 0.0–4.4)
Cholesterol, Total: 250 mg/dL — ABNORMAL HIGH (ref 100–199)
HDL: 47 mg/dL (ref >39)
LDL Chol Calc (NIH): 180 mg/dL — ABNORMAL HIGH (ref 0–99)
Triglycerides: 129 mg/dL (ref 0–149)
VLDL Cholesterol Cal: 23 mg/dL (ref 5–40)

## 2023-04-30 LAB — COMPREHENSIVE METABOLIC PANEL
ALT: 18 IU/L (ref 0–32)
AST: 14 IU/L (ref 0–40)
Albumin: 4.1 g/dL (ref 3.9–4.9)
Alkaline Phosphatase: 39 IU/L — ABNORMAL LOW (ref 44–121)
BUN/Creatinine Ratio: 13 (ref 9–23)
BUN: 10 mg/dL (ref 6–24)
Bilirubin Total: 0.4 mg/dL (ref 0.0–1.2)
CO2: 23 mmol/L (ref 20–29)
Calcium: 9.3 mg/dL (ref 8.7–10.2)
Chloride: 102 mmol/L (ref 96–106)
Creatinine, Ser: 0.8 mg/dL (ref 0.57–1.00)
Globulin, Total: 2.5 g/dL (ref 1.5–4.5)
Glucose: 100 mg/dL — ABNORMAL HIGH (ref 70–99)
Potassium: 4.5 mmol/L (ref 3.5–5.2)
Sodium: 141 mmol/L (ref 134–144)
Total Protein: 6.6 g/dL (ref 6.0–8.5)
eGFR: 90 mL/min/{1.73_m2} (ref >59)

## 2023-04-30 LAB — HEMOGLOBIN A1C
Est. average glucose Bld gHb Est-mCnc: 117 mg/dL
Hgb A1c MFr Bld: 5.7 % — ABNORMAL HIGH (ref 4.8–5.6)

## 2023-04-30 LAB — TSH: TSH: 2.08 u[IU]/mL (ref 0.450–4.500)

## 2023-05-06 ENCOUNTER — Ambulatory Visit (INDEPENDENT_AMBULATORY_CARE_PROVIDER_SITE_OTHER): Payer: Managed Care, Other (non HMO) | Admitting: Family Medicine

## 2023-05-06 ENCOUNTER — Encounter: Payer: Self-pay | Admitting: Family Medicine

## 2023-05-06 VITALS — BP 122/81 | HR 78 | Resp 18 | Ht 67.0 in | Wt 247.0 lb

## 2023-05-06 DIAGNOSIS — Z0000 Encounter for general adult medical examination without abnormal findings: Secondary | ICD-10-CM

## 2023-05-06 DIAGNOSIS — I10 Essential (primary) hypertension: Secondary | ICD-10-CM | POA: Diagnosis not present

## 2023-05-06 DIAGNOSIS — E7800 Pure hypercholesterolemia, unspecified: Secondary | ICD-10-CM

## 2023-05-06 DIAGNOSIS — F341 Dysthymic disorder: Secondary | ICD-10-CM

## 2023-05-06 DIAGNOSIS — Z6835 Body mass index (BMI) 35.0-35.9, adult: Secondary | ICD-10-CM

## 2023-05-06 NOTE — Assessment & Plan Note (Signed)
Last lipid panel: LDL 180, HDL 47, triglycerides 129.  Patient would like to work on continuing with her exercise routine and weight management before starting any medication.  We will continue to monitor her cholesterol levels as every 6 to 12 months.

## 2023-05-06 NOTE — Assessment & Plan Note (Signed)
Sheri Cole was not covered by insurance.  We briefly discussed other medication options including phentermine, topiramate, Wellbutrin.  She is going to check which medicines her insurance will cover and let me know.  If she does start a new medication, we will follow-up in 1-2 months after starting.

## 2023-05-06 NOTE — Assessment & Plan Note (Signed)
Patient states that mood is currently fairly stable, but she does note that she has a lot of stress going on right now.  PHQ 9 score of 11 today, GAD-7 score of 12.  Both scores are much higher than her baseline.  She does not feel that any adjustment in her current routine is necessary at this moment, but we did discuss that if she does get to the point where she would like to increase Prozac, it would be an option to increase to 60 mg daily.  For now, continue Prozac 40 mg daily.  Will continue to monitor and adjust medication as needed.

## 2023-05-06 NOTE — Assessment & Plan Note (Signed)
Stable.  Continue amlodipine-valsartan and hydrochlorothiazide. Will continue to monitor.

## 2023-05-06 NOTE — Patient Instructions (Addendum)
Keep up the great work! You are really doing amazing :)   The medicines that we talked about today are phentermine and topiramate, either one at a time or together.  To give you a rough estimate of out of pocket costs if not covered by insurance, I referenced the [employer].  Plenity: OTC, around $100/month Phentermine: $11/month Orlistat: $50/month Qsymia (phentermine + topiramate): $196/month if brand name OR around $20/month if prescribed separately as phentermine ($11/month) + topiramate ($9/month) Contrave (naltrexone + bupropion): $610/month if brand name OR around $50/month if prescribed separately as naltrexone ($32/month) + bupropion ($15/month) Injectable GLP-1 (Zepbound, [place], Saxenda): $1000-1500/month

## 2023-05-06 NOTE — Progress Notes (Signed)
Complete physical exam  Patient: Sheri Cole   DOB: [birth year]   51 y.o. Female  MRN: [id number]  Subjective:    Chief Complaint  Patient presents with   Annual Exam    Sheri Cole is a 52 y.o. female who presents today for a complete physical exam. She reports consuming a  low carb  diet.  She is working on getting back into a routine walking.  She generally feels well. She reports sleeping fairly well. She does not have additional problems to discuss today.  She would like to find an alternative to [facility] since her insurance would not cover it, but she is maintaining her weight well.   Most recent fall risk assessment:    11/05/2022    8:23 AM  Fall Risk   Falls in the past year? 0  Number falls in past yr: 0  Injury with Fall? 0  Risk for fall due to : No Fall Risks  Follow up Falls evaluation completed     Most recent depression and anxiety screenings:    05/06/2023    8:35 AM 11/05/2022    8:22 AM  PHQ 2/9 Scores  PHQ - 2 Score 2 1  PHQ- 9 Score 11 2      05/06/2023    8:36 AM 11/05/2022    8:23 AM 03/20/2022    2:37 PM 04/25/2021    2:49 PM  GAD 7 : Generalized Anxiety Score  Nervous, Anxious, on Edge 2 0 0 0  Control/stop worrying 2 0 0 0  Worry too much - different things 1 0 0 0  Trouble relaxing 2 0 1 0  Restless 1 0 0 0  Easily annoyed or irritable 2 0 0 0  Afraid - awful might happen 2 0 0 0  Total GAD 7 Score 12 0 1 0  Anxiety Difficulty Somewhat difficult  Not difficult at all     Patient Active Problem List   Diagnosis Date Noted   Complex sclerosing lesion of left breast 08/01/2021   Mass of upper outer quadrant of right breast 05/21/2021   Family history of diabetes mellitus in father- in 70's; TypeI 2018-02-17   Hyperlipidemia 02-17-2018   History of vitamin D deficiency February 17, 2018   Widowed-  husband died Suicide 5 yrs ago 02/17/18   Family history of breast cancer in female 2018-02-17   Chronic left-sided low back pain  with left-sided sciatica 2018/02/17   Facet arthropathy, lumbosacral 2018-02-17   Lumbar discogenic pain syndrome February 17, 2018   Dysthymia 02-17-18   Class 2 severe obesity with serious comorbidity and body mass index (BMI) of 35.0 to 35.9 in adult, unspecified obesity type (HCC) 2018-02-17   Essential hypertension 07/13/2015    Past Surgical History:  Procedure Laterality Date   BREAST LUMPECTOMY WITH RADIOACTIVE SEED LOCALIZATION Left 06/29/2021   Procedure: LEFT BREAST LUMPECTOMY WITH RADIOACTIVE SEED LOCALIZATION;  Surgeon: Griselda Miner, MD;  Location: [facility];  Service: General;  Laterality: Left;   DILATATION & CURETTAGE/HYSTEROSCOPY WITH MYOSURE N/A 10/14/2014   Procedure: DILATATION & CURETTAGE/HYSTEROSCOPY WITH MYOSURE;  Surgeon: Alphonsus Sias. Ernestina Penna, MD;  Location: [facility];  Service: Gynecology;  Laterality: N/A;   NO PAST SURGERIES     Social History   Tobacco Use   Smoking status: Never    Passive exposure: Never   Smokeless tobacco: Never  Vaping Use   Vaping status: Never Used  Substance Use Topics   Alcohol use: Yes  Alcohol/week: 0.0 standard drinks of alcohol    Comment: occasionally   Drug use: No   Family History  Problem Relation Age of Onset   Cancer Father        metastatic lung (smoker)   Diabetes Father    Cancer Maternal Grandmother        breast, leukemia   Cancer Paternal Grandmother        ovarian or cervical   Hypertension Neg Hx    CAD Neg Hx    Stroke Neg Hx    Colon cancer Neg Hx    Esophageal cancer Neg Hx    Rectal cancer Neg Hx    Stomach cancer Neg Hx    Allergies  Allergen Reactions   Sulfonamide Derivatives Hives     Patient Care Team: Melida Quitter, PA as PCP - General (Family Medicine) Obgyn, Ma Hillock as Attending Physician (Gynecology) Julio Sicks, MD as Consulting Physician (Neurosurgery) Janalyn Harder, MD (Inactive) as Consulting Physician (Dermatology) Noland Fordyce, MD as Consulting Physician  (Obstetrics and Gynecology)   Outpatient Medications Prior to Visit  Medication Sig   amLODipine-valsartan (EXFORGE) 5-160 MG tablet Take 1 tablet by mouth daily.   Calcium Carb-Cholecalciferol 500-200 MG-UNIT TABS Take 1 tablet by mouth daily.   FLUoxetine (PROZAC) 40 MG capsule Take 1 capsule (40 mg total) by mouth daily.   hydrochlorothiazide (HYDRODIURIL) 25 MG tablet Take 0.5 tablets (12.5 mg total) by mouth daily.   vitamin B-12 (CYANOCOBALAMIN) 1000 MCG tablet Take 1,000 mcg by mouth daily.   [DISCONTINUED] Semaglutide-Weight Management (WEGOVY) 0.25 MG/0.5ML SOAJ Inject 0.25 mg into the skin once a week. (Patient not taking: Reported on 05/06/2023)   No facility-administered medications prior to visit.    Review of Systems  Constitutional:  Negative for chills, fever and malaise/fatigue.  HENT:  Negative for congestion and hearing loss.   Eyes:  Negative for blurred vision and double vision.  Respiratory:  Negative for cough and shortness of breath.   Cardiovascular:  Negative for chest pain, palpitations and leg swelling.  Gastrointestinal:  Negative for abdominal pain, constipation, diarrhea and heartburn.  Genitourinary:  Negative for frequency and urgency.  Musculoskeletal:  Negative for myalgias and neck pain.  Neurological:  Negative for headaches.  Endo/Heme/Allergies:  Negative for polydipsia.  Psychiatric/Behavioral:  Negative for depression. The patient is not nervous/anxious.       Objective:    BP 122/81 (BP Location: Left Arm, Patient Position: Sitting, Cuff Size: Large)   Pulse 78   Resp 18   Ht 5\' 7"  (1.702 m)   Wt 247 lb (112 kg)   SpO2 98%   BMI 38.69 kg/m    Physical Exam Constitutional:      General: She is not in acute distress.    Appearance: Normal appearance.  HENT:     Head: Normocephalic and atraumatic.     Right Ear: Tympanic membrane, ear canal and external ear normal.     Left Ear: Tympanic membrane, ear canal and external ear normal.      Nose: Nose normal.     Mouth/Throat:     Mouth: Mucous membranes are moist.     Pharynx: No oropharyngeal exudate or posterior oropharyngeal erythema.  Eyes:     Extraocular Movements: Extraocular movements intact.     Conjunctiva/sclera: Conjunctivae normal.     Pupils: Pupils are equal, round, and reactive to light.  Neck:     Thyroid: No thyroid mass, thyromegaly or thyroid tenderness.  Cardiovascular:  Rate and Rhythm: Normal rate and regular rhythm.     Heart sounds: Normal heart sounds. No murmur heard.    No friction rub. No gallop.  Pulmonary:     Effort: Pulmonary effort is normal. No respiratory distress.     Breath sounds: Normal breath sounds. No wheezing, rhonchi or rales.  Abdominal:     General: Abdomen is flat. Bowel sounds are normal. There is no distension.     Palpations: There is no mass.     Tenderness: There is no abdominal tenderness. There is no guarding.  Musculoskeletal:        General: Normal range of motion.     Cervical back: Normal range of motion and neck supple.  Lymphadenopathy:     Cervical: No cervical adenopathy.  Skin:    General: Skin is warm and dry.  Neurological:     Mental Status: She is alert and oriented to person, place, and time.     Cranial Nerves: No cranial nerve deficit.     Motor: No weakness.     Deep Tendon Reflexes: Reflexes normal.  Psychiatric:        Mood and Affect: Mood normal.        Assessment & Plan:    Routine Health Maintenance and Physical Exam  Immunization History  Administered Date(s) Administered   PFIZER Comirnaty(Gray Top)Covid-19 Tri-Sucrose Vaccine 07/17/2020, 08/07/2020   Tdap 06/23/2012, 11/05/2022    Health Maintenance  Topic Date Due   COVID-19 Vaccine (3 - 2023-24 season) 05/10/2022   Zoster Vaccines- Shingrix (1 of 2) Never done   INFLUENZA VACCINE  12/08/2023 (Originally 04/10/2023)   PAP SMEAR-Modifier  04/18/2024   MAMMOGRAM  03/11/2025   Colonoscopy  09/04/2029    DTaP/Tdap/Td (3 - Td or Tdap) 11/05/2032   Hepatitis C Screening  Completed   HIV Screening  Completed   HPV VACCINES  Aged Out    Reviewed most recent labs including CBC, CMP, lipid panel, A1C, TSH, and vitamin D. All within normal limits/stable from last check other than LDL increased to 180 from 562, A1c prediabetic at 5.7, alkaline phosphatase low at 39 but stable. We discussed recommendations for shingles vaccine starting at age 63.  She has 2 cruises planned within the next few months so she is not planning on getting them right now, but she does plan on getting them at the beginning of 2025 after her travels are over.    Discussed health benefits of physical activity, and encouraged her to engage in regular exercise appropriate for her age and condition.  Wellness examination  Essential hypertension Assessment & Plan: Stable.  Continue amlodipine-valsartan and hydrochlorothiazide. Will continue to monitor.   Dysthymia Assessment & Plan: Patient states that mood is currently fairly stable, but she does note that she has a lot of stress going on right now.  PHQ 9 score of 11 today, GAD-7 score of 12.  Both scores are much higher than her baseline.  She does not feel that any adjustment in her current routine is necessary at this moment, but we did discuss that if she does get to the point where she would like to increase Prozac, it would be an option to increase to 60 mg daily.  For now, continue Prozac 40 mg daily.  Will continue to monitor and adjust medication as needed.   Pure hypercholesterolemia Assessment & Plan: Last lipid panel: LDL 180, HDL 47, triglycerides 129.  Patient would like to work on continuing with her exercise routine  and weight management before starting any medication.  We will continue to monitor her cholesterol levels as every 6 to 12 months.   Class 2 severe obesity with serious comorbidity and body mass index (BMI) of 35.0 to 35.9 in adult, unspecified  obesity type [facility]) Assessment & Plan: Reginal Lutes was not covered by insurance.  We briefly discussed other medication options including phentermine, topiramate, Wellbutrin.  She is going to check which medicines her insurance will cover and let me know.  If she does start a new medication, we will follow-up in 1-2 months after starting.     Return in about 6 months (around 11/06/2023) for follow-up for HLD, prediabetes, mood, fasting blood work 1 week before.     Melida Quitter, PA

## 2023-05-13 ENCOUNTER — Encounter: Payer: Self-pay | Admitting: Family Medicine

## 2023-05-13 MED ORDER — TOPIRAMATE 25 MG PO CPSP
25.0000 mg | ORAL_CAPSULE | Freq: Every day | ORAL | 2 refills | Status: DC
Start: 2023-05-13 — End: 2023-08-25

## 2023-05-13 MED ORDER — PHENTERMINE HCL 15 MG PO CAPS
15.0000 mg | ORAL_CAPSULE | ORAL | 2 refills | Status: DC
Start: 2023-05-13 — End: 2023-08-22

## 2023-06-06 ENCOUNTER — Other Ambulatory Visit: Payer: Self-pay

## 2023-06-06 DIAGNOSIS — I10 Essential (primary) hypertension: Secondary | ICD-10-CM

## 2023-06-06 MED ORDER — HYDROCHLOROTHIAZIDE 25 MG PO TABS: 12.5000 mg | ORAL_TABLET | Freq: Every day | ORAL | 1 refills | Status: DC

## 2023-08-10 ENCOUNTER — Other Ambulatory Visit: Payer: Self-pay | Admitting: Family Medicine

## 2023-08-19 ENCOUNTER — Other Ambulatory Visit: Payer: Self-pay | Admitting: Family Medicine

## 2023-08-20 ENCOUNTER — Encounter: Payer: Self-pay | Admitting: Family Medicine

## 2023-08-20 DIAGNOSIS — E66812 Obesity, class 2: Secondary | ICD-10-CM

## 2023-08-20 NOTE — Telephone Encounter (Signed)
Patient needs to schedule a follow-up appointment within the next month before I will send in a refill

## 2023-08-22 MED ORDER — PHENTERMINE HCL 15 MG PO CAPS
15.0000 mg | ORAL_CAPSULE | ORAL | 2 refills | Status: DC
Start: 2023-08-22 — End: 2023-09-23

## 2023-08-22 NOTE — Addendum Note (Signed)
Addended by: Saralyn Pilar on: 08/22/2023 08:40 AM   Modules accepted: Orders

## 2023-08-23 ENCOUNTER — Other Ambulatory Visit: Payer: Self-pay | Admitting: Family Medicine

## 2023-09-06 ENCOUNTER — Other Ambulatory Visit: Payer: Self-pay | Admitting: Family Medicine

## 2023-09-06 DIAGNOSIS — Z6379 Other stressful life events affecting family and household: Secondary | ICD-10-CM

## 2023-09-06 DIAGNOSIS — I10 Essential (primary) hypertension: Secondary | ICD-10-CM

## 2023-09-06 DIAGNOSIS — F341 Dysthymic disorder: Secondary | ICD-10-CM

## 2023-09-09 ENCOUNTER — Other Ambulatory Visit: Payer: Self-pay | Admitting: Family Medicine

## 2023-09-09 DIAGNOSIS — E66812 Obesity, class 2: Secondary | ICD-10-CM

## 2023-09-09 MED ORDER — TOPIRAMATE 25 MG PO CPSP
25.0000 mg | ORAL_CAPSULE | Freq: Every day | ORAL | 0 refills | Status: DC
Start: 2023-09-09 — End: 2023-11-19

## 2023-09-23 ENCOUNTER — Telehealth: Payer: 59 | Admitting: Family Medicine

## 2023-09-23 ENCOUNTER — Encounter: Payer: Self-pay | Admitting: Family Medicine

## 2023-09-23 DIAGNOSIS — Z6835 Body mass index (BMI) 35.0-35.9, adult: Secondary | ICD-10-CM

## 2023-09-23 DIAGNOSIS — E66812 Obesity, class 2: Secondary | ICD-10-CM

## 2023-09-23 DIAGNOSIS — I10 Essential (primary) hypertension: Secondary | ICD-10-CM | POA: Diagnosis not present

## 2023-09-23 MED ORDER — PHENTERMINE HCL 15 MG PO CAPS
15.0000 mg | ORAL_CAPSULE | ORAL | 0 refills | Status: DC
Start: 2023-09-23 — End: 2023-11-20

## 2023-09-23 MED ORDER — HYDROCHLOROTHIAZIDE 25 MG PO TABS: 12.5000 mg | ORAL_TABLET | Freq: Every day | ORAL | 1 refills | Status: AC

## 2023-09-23 NOTE — Assessment & Plan Note (Signed)
 Starting Weight: 247 lbs Current weight: 233 lbs Total weight change: -14 lbs (5.7% of starting weight) Dietary goals: low carb Exercise goals: start exercise routine and stay consistent Medication: Phentermine  15 mg daily and topiramate  25 mg daily Follow-up and referrals: Follow-up in 3 months

## 2023-09-23 NOTE — Progress Notes (Signed)
 Virtual Visit via Video Note  I connected with Sheri Cole on 09/23/23 at  2:10 PM EST by a video enabled telemedicine application and verified that I am speaking with the correct person using two identifiers.  Patient Location: Home Provider Location: Office/clinic  I discussed the limitations, risks, security, and privacy concerns of performing an evaluation and management service by video and the availability of in person appointments. I also discussed with the patient that there may be a patient responsible charge related to this service. The patient expressed understanding and agreed to proceed.    Subjective   Patient ID: Sheri Cole, female    DOB: 04-Nov-1970  Age: 53 y.o. MRN: [id number]  Chief Complaint  Patient presents with   Medication Management    HPI Sheri Cole is a 53 y.o. female presenting today for follow up of weight management. Weight has decreased 14 lbs since last visit. They have been following a low-carb diet and has typically been walking about 3 miles every morning.  She has deviated some from the walking routine given the cold weather recently.  She is still tolerating topiramate  and phentermine  well.  Her father did pass away 2 days ago which has been very sad, so she has been coping with that.  On top of it, she has a stomach bug today and has been resting and only sipping water.  ROS Negative unless otherwise noted in HPI  Outpatient Medications Prior to Visit  Medication Sig   amLODipine -valsartan  (EXFORGE ) 5-160 MG tablet TAKE 1 TABLET DAILY   Calcium Carb-Cholecalciferol 500-200 MG-UNIT TABS Take 1 tablet by mouth daily.   FLUoxetine  (PROZAC ) 40 MG capsule TAKE 1 CAPSULE DAILY   topiramate  (TOPAMAX ) 25 MG capsule Take 1 capsule (25 mg total) by mouth daily.   vitamin B-12 (CYANOCOBALAMIN) 1000 MCG tablet Take 1,000 mcg by mouth daily.   [DISCONTINUED] hydrochlorothiazide  (HYDRODIURIL ) 25 MG tablet Take 0.5 tablets (12.5  mg total) by mouth daily.   [DISCONTINUED] phentermine  15 MG capsule Take 1 capsule (15 mg total) by mouth every morning.   No facility-administered medications prior to visit.     Objective:     Physical Exam General: Speaking clearly in complete sentences without any shortness of breath.  Alert and oriented x3.  Normal judgment. No apparent acute distress.   Assessment & Plan:  Class 2 severe obesity with serious comorbidity and body mass index (BMI) of 35.0 to 35.9 in adult, unspecified obesity type (HCC) Assessment & Plan: Starting Weight: 247 lbs Current weight: 233 lbs Total weight change: -14 lbs (5.7% of starting weight) Dietary goals: low carb Exercise goals: start exercise routine and stay consistent Medication: Phentermine  15 mg daily and topiramate  25 mg daily Follow-up and referrals: Follow-up in 3 months  Orders: -     Phentermine  HCl; Take 1 capsule (15 mg total) by mouth every morning.  Dispense: 90 capsule; Refill: 0  Essential hypertension -     hydroCHLOROthiazide ; Take 0.5 tablets (12.5 mg total) by mouth daily.  Dispense: 90 tablet; Refill: 1    Return in about 3 months (around 12/22/2023) for follow-up for weight management.   I discussed the assessment and treatment plan with the patient. The patient was provided an opportunity to ask questions, and all were answered. The patient agreed with the plan and demonstrated an understanding of the instructions.   The patient was advised to call back or seek an in-person evaluation if the symptoms worsen or if the condition  fails to improve as anticipated.  The above assessment and management plan was discussed with the patient. The patient verbalized understanding of and has agreed to the management plan.   Joesph DELENA Sear, PA

## 2023-10-16 ENCOUNTER — Encounter: Payer: Self-pay | Admitting: Family Medicine

## 2023-11-19 ENCOUNTER — Other Ambulatory Visit: Payer: Self-pay | Admitting: Family Medicine

## 2023-11-20 ENCOUNTER — Other Ambulatory Visit: Payer: Self-pay | Admitting: Family Medicine

## 2023-11-20 DIAGNOSIS — E66812 Obesity, class 2: Secondary | ICD-10-CM

## 2024-02-11 ENCOUNTER — Other Ambulatory Visit: Payer: Self-pay | Admitting: Family Medicine

## 2024-02-12 NOTE — Telephone Encounter (Signed)
 I have contacted patient and scheduled her an appointment with you on 03/31/24, please send refill.

## 2024-02-12 NOTE — Telephone Encounter (Signed)
 PDMP reviewed, no aberrancies. Refill appropriate.

## 2024-02-17 ENCOUNTER — Other Ambulatory Visit: Payer: Self-pay | Admitting: Family Medicine

## 2024-02-17 DIAGNOSIS — E66812 Obesity, class 2: Secondary | ICD-10-CM

## 2024-02-17 NOTE — Progress Notes (Unsigned)
   Acute Office Visit  Subjective:     Patient ID: Sheri Cole, female    DOB: 06-Jul-1971, 53 y.o.   MRN: [id number]  No chief complaint on file.   HPI  Sheri Cole is a 53 y.o. female who presents to the clinic today c/o nausea and diarrhea. Pt reports that from Thurs May 29 - June 3rd is when her stomach issues started and she was very constipated during that time. She took *** for relief. Patient then reports that on Fri June 6, she woke up extremely nauseous but did not vomit. She was able to tolerate light foods (peanut butter, toast, etc.), but reports that Friday night after eating a slice of cheese pizza, she became ill from vomiting. Over the weekend, she reports that she rested and hydrated with water and gatorade as tolerated. She was also able to tolerate eggs, plain rice, and dry toast over the weekend, but still experiencing nausea. She reports that by Monday, her symptoms had resolved and she was cautious with her food intake, but woke up yesterday (Tues morning) with nausea and diarrhea.   She has tried ***  Denies sick contacts or recent travel ***  Pain is localized to ***  Fevers:  Changes in meds: Triggers:  Appetite:  Bowel Movements:  Character of diarrhea:    ROS Per HPI     Objective:    There were no vitals taken for this visit. {Vitals History (Optional):23777}  Physical Exam  No results found for any visits on 02/18/24.      Assessment & Plan:   ***  Assessment and Plan              No follow-ups on file.  Odilia Bennett, PA-C

## 2024-02-18 ENCOUNTER — Ambulatory Visit (INDEPENDENT_AMBULATORY_CARE_PROVIDER_SITE_OTHER)

## 2024-02-18 VITALS — BP 119/87 | HR 76 | Ht 67.0 in | Wt 217.2 lb

## 2024-02-18 DIAGNOSIS — R112 Nausea with vomiting, unspecified: Secondary | ICD-10-CM | POA: Insufficient documentation

## 2024-02-18 MED ORDER — ONDANSETRON HCL 4 MG PO TABS: 4.0000 mg | ORAL_TABLET | Freq: Three times a day (TID) | ORAL | 0 refills | Status: DC | PRN

## 2024-02-18 MED ORDER — OMEPRAZOLE 40 MG PO CPDR
40.0000 mg | DELAYED_RELEASE_CAPSULE | Freq: Every day | ORAL | 0 refills | Status: DC
Start: 2024-02-18 — End: 2024-07-02

## 2024-02-18 NOTE — Assessment & Plan Note (Signed)
 Discussed possible etiologies of symptoms with the patient including postviral gastritis, H. pylori, pancreatitis.  Discussed with patient that symptoms most likely align with postviral gastritis at this time.  Prescribed Zofran  4 mg tablet to take every 8 hours as needed and a 2-week course of omeprazole 40 mg to help with symptoms.  Advised patient to continue a bland diet (plain toast, peanut butter, bananas, applesauce, plain white rice) and for the next few days to continue eating more frequent, small meals that will be easier on her stomach.  Encouraged the patient to continue drinking fluids and Gatorade as tolerated.  And checking labs today including CBC, CMP, lipid panel, lipase.  Advised patient that if symptoms have not improved in 3 to 5 days to schedule a follow-up appointment to be reevaluated.  If symptoms do not improve, can consider urea breath test to rule out H. pylori or stool sample testing.

## 2024-02-18 NOTE — Patient Instructions (Addendum)
 It was nice to see you today!  As we discussed in clinic:   -I believe your symptoms are related to post-viral gastritis, which is inflammation of the gut/colon following a viral stomach bug.  -We treat this with nausea medication, proton pump inhibitors, and eating smaller, but more frequent meals that will be easy on our stomachs (toast, peanut butter, bananas, apple sauce, etc.) for a few days.  -I have sent in Zofran , which is an anti-nausea medication, to take every 8 hours as needed for nausea.  -I have also sent in a 2 week supply of Omeprazole 40 mg to reduce stomach acid and relieve the burning sensation.  -We got labs today, which I will send you a message explaining the results wen they arrive.   -If your symptoms worsen or do not improve by Monday, please call or send me a MyChart message so I can see you again for further evaluation.  If you have any problems before your next visit feel free to message me via MyChart (minor issues or questions) or call the office, otherwise you may reach out to schedule an office visit.  Thank you! Meryl Acosta, PA-C

## 2024-02-19 ENCOUNTER — Ambulatory Visit: Payer: Self-pay

## 2024-02-19 LAB — COMPREHENSIVE METABOLIC PANEL WITH GFR
ALT: 19 IU/L (ref 0–32)
AST: 13 IU/L (ref 0–40)
Albumin: 4.3 g/dL (ref 3.8–4.9)
Alkaline Phosphatase: 37 IU/L — ABNORMAL LOW (ref 44–121)
BUN/Creatinine Ratio: 17 (ref 9–23)
BUN: 14 mg/dL (ref 6–24)
Bilirubin Total: 0.4 mg/dL (ref 0.0–1.2)
CO2: 23 mmol/L (ref 20–29)
Calcium: 9.7 mg/dL (ref 8.7–10.2)
Chloride: 102 mmol/L (ref 96–106)
Creatinine, Ser: 0.83 mg/dL (ref 0.57–1.00)
Globulin, Total: 2.5 g/dL (ref 1.5–4.5)
Glucose: 87 mg/dL (ref 70–99)
Potassium: 4 mmol/L (ref 3.5–5.2)
Sodium: 141 mmol/L (ref 134–144)
Total Protein: 6.8 g/dL (ref 6.0–8.5)
eGFR: 85 mL/min/{1.73_m2} (ref >59)

## 2024-02-19 LAB — CBC WITH DIFFERENTIAL/PLATELET
Basophils Absolute: 0.1 10*3/uL (ref 0.0–0.2)
Basos: 1 %
EOS (ABSOLUTE): 0.1 10*3/uL (ref 0.0–0.4)
Eos: 1 %
Hematocrit: 44.8 % (ref 34.0–46.6)
Hemoglobin: 14.8 g/dL (ref 11.1–15.9)
Immature Grans (Abs): 0 10*3/uL (ref 0.0–0.1)
Immature Granulocytes: 0 %
Lymphocytes Absolute: 2.3 10*3/uL (ref 0.7–3.1)
Lymphs: 34 %
MCH: 30.5 pg (ref 26.6–33.0)
MCHC: 33 g/dL (ref 31.5–35.7)
MCV: 92 fL (ref 79–97)
Monocytes Absolute: 0.6 10*3/uL (ref 0.1–0.9)
Monocytes: 9 %
Neutrophils Absolute: 3.9 10*3/uL (ref 1.4–7.0)
Neutrophils: 55 %
Platelets: 343 10*3/uL (ref 150–450)
RBC: 4.86 x10E6/uL (ref 3.77–5.28)
RDW: 13 % (ref 11.7–15.4)
WBC: 6.9 10*3/uL (ref 3.4–10.8)

## 2024-02-19 LAB — LIPID PANEL
Chol/HDL Ratio: 4.2 ratio (ref 0.0–4.4)
Cholesterol, Total: 194 mg/dL (ref 100–199)
HDL: 46 mg/dL (ref >39)
LDL Chol Calc (NIH): 124 mg/dL — ABNORMAL HIGH (ref 0–99)
Triglycerides: 136 mg/dL (ref 0–149)
VLDL Cholesterol Cal: 24 mg/dL (ref 5–40)

## 2024-02-19 LAB — LIPASE: Lipase: 91 U/L — ABNORMAL HIGH (ref 14–72)

## 2024-03-31 ENCOUNTER — Ambulatory Visit (INDEPENDENT_AMBULATORY_CARE_PROVIDER_SITE_OTHER)

## 2024-03-31 VITALS — BP 113/79 | HR 67 | Temp 97.8°F | Ht 67.0 in | Wt 215.1 lb

## 2024-03-31 DIAGNOSIS — E66812 Obesity, class 2: Secondary | ICD-10-CM

## 2024-03-31 DIAGNOSIS — Z6835 Body mass index (BMI) 35.0-35.9, adult: Secondary | ICD-10-CM

## 2024-03-31 MED ORDER — PHENTERMINE HCL 30 MG PO CAPS: 30.0000 mg | ORAL_CAPSULE | ORAL | 2 refills | Status: DC

## 2024-03-31 NOTE — Patient Instructions (Signed)
 It was nice to see you today!  As we discussed in clinic:  -Continue your Topamax  daily as prescribed. -I have sent in the increased dose of your phentermine , which is 30 mg daily.  What ever you have left of the 15 mg tablets, you can take 2 of them to you run out and then pick up the increased dose at the pharmacy. - Continue to prioritize protein, fiber, water.  Keep up the great work! - I will plan on seeing you back in 3 months for your physical with fasting blood work the week before.  It was nice to see you and I am glad you are feeling better!   If you have any problems before your next visit feel free to message me via MyChart (minor issues or questions) or call the office, otherwise you may reach out to schedule an office visit.  Thank you! Saddie Sacks, PA-C

## 2024-03-31 NOTE — Assessment & Plan Note (Signed)
 Visit # :4 Starting Weight: 247 lbs  Current weight: 215 Previous weight: 233 Change in weight: -32 lbs Dietary goals: High protein, low carb Exercise goals: More routine/planned walking  Medication: Phentermine  30 mg daily, topiramate  25 mg daily  Follow-up and referrals: follow up in 3 months

## 2024-03-31 NOTE — Progress Notes (Signed)
   Established Patient Office Visit  Subjective   Patient ID: Young Brim, female    DOB: 03/16/1971  Age: 53 y.o. MRN: [id number]  Chief Complaint  Patient presents with   Weight Management Screening    HPI  Brinlyn Cena is a 53 y.o. y/o female who presents to the clinic today for follow up on weight management.  Patient currently taking Topamax  25 mg and Phentermine  15 mg daily for weight loss. Reports that she is tolerating both of these medications very well. Denies side effects. Has been on these medications since September of last year. Reports she has lost approximately 30+ lbs since starting this medications. She is eating less at each meal and prioritizing protein and water intake. She has been walking more and swimming in the pool for exercise. Would like to increase the dose of her phentermine  as she has started to notice that the medication does not keep her appetite as suppressed as previous.   Patient also reports that she is feeling much better from her bout of gastritis last month.    ROS Per HPI.    Objective:     BP 113/79   Pulse 67   Temp 97.8 F (36.6 C) (Oral)   Ht 5' 7 (1.702 m)   Wt 215 lb 1.3 oz (97.6 kg)   SpO2 100%   BMI 33.69 kg/m    Physical Exam Constitutional:      General: She is not in acute distress.    Appearance: Normal appearance.  Cardiovascular:     Rate and Rhythm: Normal rate and regular rhythm.     Heart sounds: Normal heart sounds. No murmur heard.    No friction rub. No gallop.  Pulmonary:     Effort: Pulmonary effort is normal. No respiratory distress.     Breath sounds: Normal breath sounds.  Musculoskeletal:        General: No swelling.  Skin:    General: Skin is warm and dry.  Neurological:     General: No focal deficit present.     Mental Status: She is alert.  Psychiatric:        Mood and Affect: Mood normal.        Behavior: Behavior normal.        Thought Content: Thought content normal.     No results found for any visits on 03/31/24.    The 10-year ASCVD risk score (Arnett DK, et al., 2019) is: 1.7%    Assessment & Plan:   Class 2 severe obesity with serious comorbidity and body mass index (BMI) of 35.0 to 35.9 in adult, unspecified obesity type [facility]) Assessment & Plan: Visit # :4 Starting Weight: 247 lbs  Current weight: 215 Previous weight: 233 Change in weight: -32 lbs Dietary goals: High protein, low carb Exercise goals: More routine/planned walking  Medication: Phentermine  30 mg daily, topiramate  25 mg daily  Follow-up and referrals: follow up in 3 months    Other orders -     Phentermine  HCl; Take 1 capsule (30 mg total) by mouth every morning.  Dispense: 30 capsule; Refill: 2    Return in about 3 months (around 07/01/2024) for Physical.    Saddie JULIANNA Sacks, PA-C

## 2024-05-23 ENCOUNTER — Other Ambulatory Visit: Payer: Self-pay | Admitting: Family Medicine

## 2024-06-19 ENCOUNTER — Other Ambulatory Visit: Payer: Self-pay

## 2024-06-19 DIAGNOSIS — E66812 Obesity, class 2: Secondary | ICD-10-CM

## 2024-06-25 ENCOUNTER — Other Ambulatory Visit

## 2024-06-25 DIAGNOSIS — E66812 Obesity, class 2: Secondary | ICD-10-CM

## 2024-06-26 LAB — CBC WITH DIFFERENTIAL/PLATELET
Basophils Absolute: 0 x10E3/uL (ref 0.0–0.2)
Basos: 1 %
EOS (ABSOLUTE): 0.1 x10E3/uL (ref 0.0–0.4)
Eos: 1 %
Hematocrit: 40.2 % (ref 34.0–46.6)
Hemoglobin: 13.2 g/dL (ref 11.1–15.9)
Immature Grans (Abs): 0 x10E3/uL (ref 0.0–0.1)
Immature Granulocytes: 0 %
Lymphocytes Absolute: 1.8 x10E3/uL (ref 0.7–3.1)
Lymphs: 31 %
MCH: 30.4 pg (ref 26.6–33.0)
MCHC: 32.8 g/dL (ref 31.5–35.7)
MCV: 93 fL (ref 79–97)
Monocytes Absolute: 0.4 x10E3/uL (ref 0.1–0.9)
Monocytes: 7 %
Neutrophils Absolute: 3.4 x10E3/uL (ref 1.4–7.0)
Neutrophils: 60 %
Platelets: 370 x10E3/uL (ref 150–450)
RBC: 4.34 x10E6/uL (ref 3.77–5.28)
RDW: 12.6 % (ref 11.7–15.4)
WBC: 5.7 x10E3/uL (ref 3.4–10.8)

## 2024-06-26 LAB — COMPREHENSIVE METABOLIC PANEL WITH GFR
ALT: 16 IU/L (ref 0–32)
AST: 19 IU/L (ref 0–40)
Albumin: 4.5 g/dL (ref 3.8–4.9)
Alkaline Phosphatase: 37 IU/L — ABNORMAL LOW (ref 49–135)
BUN/Creatinine Ratio: 21 (ref 9–23)
BUN: 19 mg/dL (ref 6–24)
Bilirubin Total: 0.2 mg/dL (ref 0.0–1.2)
CO2: 23 mmol/L (ref 20–29)
Calcium: 9.6 mg/dL (ref 8.7–10.2)
Chloride: 101 mmol/L (ref 96–106)
Creatinine, Ser: 0.89 mg/dL (ref 0.57–1.00)
Globulin, Total: 2.2 g/dL (ref 1.5–4.5)
Glucose: 100 mg/dL — ABNORMAL HIGH (ref 70–99)
Potassium: 4.3 mmol/L (ref 3.5–5.2)
Sodium: 138 mmol/L (ref 134–144)
Total Protein: 6.7 g/dL (ref 6.0–8.5)
eGFR: 78 mL/min/1.73 (ref >59)

## 2024-06-26 LAB — LIPID PANEL
Chol/HDL Ratio: 3.6 ratio (ref 0.0–4.4)
Cholesterol, Total: 218 mg/dL — ABNORMAL HIGH (ref 100–199)
HDL: 61 mg/dL (ref >39)
LDL Chol Calc (NIH): 144 mg/dL — ABNORMAL HIGH (ref 0–99)
Triglycerides: 74 mg/dL (ref 0–149)
VLDL Cholesterol Cal: 13 mg/dL (ref 5–40)

## 2024-06-26 LAB — HEMOGLOBIN A1C
Est. average glucose Bld gHb Est-mCnc: 105 mg/dL
Hgb A1c MFr Bld: 5.3 % (ref 4.8–5.6)

## 2024-06-26 LAB — TSH: TSH: 1.7 u[IU]/mL (ref 0.450–4.500)

## 2024-06-26 LAB — VITAMIN D 25 HYDROXY (VIT D DEFICIENCY, FRACTURES): Vit D, 25-Hydroxy: 53.1 ng/mL (ref 30.0–100.0)

## 2024-06-28 ENCOUNTER — Ambulatory Visit: Payer: Self-pay

## 2024-07-02 ENCOUNTER — Ambulatory Visit (INDEPENDENT_AMBULATORY_CARE_PROVIDER_SITE_OTHER)

## 2024-07-02 VITALS — BP 121/75 | HR 79 | Temp 97.6°F | Ht 67.0 in | Wt 210.1 lb

## 2024-07-02 DIAGNOSIS — E7800 Pure hypercholesterolemia, unspecified: Secondary | ICD-10-CM

## 2024-07-02 DIAGNOSIS — Z6835 Body mass index (BMI) 35.0-35.9, adult: Secondary | ICD-10-CM

## 2024-07-02 DIAGNOSIS — Z0000 Encounter for general adult medical examination without abnormal findings: Secondary | ICD-10-CM | POA: Diagnosis not present

## 2024-07-02 DIAGNOSIS — I10 Essential (primary) hypertension: Secondary | ICD-10-CM | POA: Diagnosis not present

## 2024-07-02 DIAGNOSIS — F341 Dysthymic disorder: Secondary | ICD-10-CM

## 2024-07-02 DIAGNOSIS — E66812 Obesity, class 2: Secondary | ICD-10-CM

## 2024-07-02 DIAGNOSIS — K5900 Constipation, unspecified: Secondary | ICD-10-CM

## 2024-07-02 MED ORDER — PHENTERMINE HCL 30 MG PO CAPS: 30.0000 mg | ORAL_CAPSULE | ORAL | 2 refills | Status: DC

## 2024-07-02 NOTE — Patient Instructions (Signed)
 VISIT SUMMARY: Today, you had your annual physical exam. We discussed your recent dermatological procedure, glycemic control, hyperlipidemia, constipation, weight loss, and medication management.  YOUR PLAN: OBESITY: You have lost significant weight, going from 247 lbs to 207 lbs, and are actively working on weight loss. -Continue taking phentermine  and Topamax . -Keep up with your exercise routine and healthy eating habits.  HYPERLIPIDEMIA: Your LDL cholesterol is slightly elevated, but your cardiovascular risk is low. -We will continue to monitor your LDL levels. -Maintain your diet and exercise routine.  HYPERTENSION: Your blood pressure is well-controlled with your current medications. -Continue taking hydrochlorothiazide , amlodipine , and valsartan  as prescribed.  DEPRESSION: Your depression is well-managed with Prozac . -Continue taking Prozac  40 mg daily.  CONSTIPATION: You have chronic constipation, likely due to slow transit. -Increase your fiber intake to 10 grams daily. -Ensure you are drinking enough water. -Use Miralax daily as needed. -Consider using Dulcolax or Docusate for acute constipation.  GENERAL HEALTH MAINTENANCE: Your health maintenance is up to date. -Obtain records from your gynecological visit. -Consider getting shingles and pneumonia vaccinations after your travel.  If you have any problems before your next visit feel free to message me via MyChart (minor issues or questions) or call the office, otherwise you may reach out to schedule an office visit.  Thank you! Saddie Sacks, PA-C

## 2024-07-05 DIAGNOSIS — K5900 Constipation, unspecified: Secondary | ICD-10-CM | POA: Insufficient documentation

## 2024-07-05 NOTE — Assessment & Plan Note (Signed)
 Chronic constipation likely due to slow transit. No thyroid  dysfunction. - Increase fiber intake to 10 grams daily. - Ensure adequate hydration. - Use Miralax daily as needed. - Consider Dulcolax or Docusate for acute constipation.

## 2024-07-05 NOTE — Assessment & Plan Note (Signed)
 Visit # :5 Starting Weight: 247 lbs   Current weight: 210 Previous weight: 215 Change in weight: -37 lbs  Dietary goals: High protein, low carb Exercise goals: More routine/planned walking  Medication: Phentermine  30 mg daily, topiramate  25 mg daily  Follow-up and referrals: follow up in 3 months

## 2024-07-05 NOTE — Progress Notes (Signed)
 Complete physical exam  Patient: Sheri Cole   DOB: 1970/12/30   53 y.o. Female  MRN: [id number]  Subjective:    Chief Complaint  Patient presents with   Annual Exam    Physical    History of Present Illness   Sheri Cole is a 53 year old female who presents for an annual physical exam.  Cutaneous lesions - Recent dermatological procedure with biopsy performed on neck; currently awaiting results  Glycemic control - Recent fasting blood glucose flagged as elevated - Hemoglobin A1c improved from 5.7 to 5.3 over the past year - No current use of medication for glycemic control - Lifestyle modifications include weight loss and increased physical activity  Hyperlipidemia - History of elevated cholesterol levels - Not currently taking medication for hyperlipidemia  Constipation - Bowel movements occur twice weekly, often requiring significant effort - Uses fiber gummies and occasionally Miralax for symptom management - Maintains adequate hydration and consumes protein shakes, which may not aid in bowel regularity - Family history of constipation, particularly in her oldest daughter  Weight loss - Approximately 30 pounds of weight loss over the past year - Attributes weight loss to daily walking with her daughter and dietary changes  Medication management - Currently taking phentermine , Topamax , hydrochlorothiazide , amlodipine , valsartan , Prozac , and B12 supplement - Due for a refill of phentermine           Most recent fall risk assessment:    07/02/2024   10:17 AM  Fall Risk   Falls in the past year? 1  Number falls in past yr: 0  Injury with Fall? 0  Follow up Falls evaluation completed     Most recent depression screenings:    07/02/2024   10:17 AM 03/31/2024    9:31 AM  PHQ 2/9 Scores  PHQ - 2 Score 0 0  PHQ- 9 Score 0     Vision:Within last year and Dental: No current dental problems and Receives regular dental care    Patient  Care Team: Gayle Saddie JULIANNA DEVONNA as PCP - General (Physician Assistant) Obgyn, Anna as Attending Physician (Gynecology) Louis Shove, MD as Consulting Physician (Neurosurgery) Livingston Rigg, MD as Consulting Physician (Dermatology) Kandyce Sor, MD as Consulting Physician (Obstetrics and Gynecology)   Outpatient Medications Prior to Visit  Medication Sig   amLODipine -valsartan  (EXFORGE ) 5-160 MG tablet TAKE 1 TABLET DAILY   Calcium Carb-Cholecalciferol 500-200 MG-UNIT TABS Take 1 tablet by mouth daily.   FLUoxetine  (PROZAC ) 40 MG capsule TAKE 1 CAPSULE DAILY   hydrochlorothiazide  (HYDRODIURIL ) 25 MG tablet Take 0.5 tablets (12.5 mg total) by mouth daily.   topiramate  (TOPAMAX ) 25 MG capsule TAKE 1 CAPSULE DAILY   vitamin B-12 (CYANOCOBALAMIN) 1000 MCG tablet Take 1,000 mcg by mouth daily.   [DISCONTINUED] omeprazole  (PRILOSEC) 40 MG capsule Take 1 capsule (40 mg total) by mouth daily.   [DISCONTINUED] ondansetron  (ZOFRAN ) 4 MG tablet Take 1 tablet (4 mg total) by mouth every 8 (eight) hours as needed for nausea or vomiting.   [DISCONTINUED] phentermine  30 MG capsule Take 1 capsule (30 mg total) by mouth every morning.   No facility-administered medications prior to visit.    ROS   Per HPI     Objective:     BP 121/75   Pulse 79   Temp 97.6 F (36.4 C) (Oral)   Ht 5' 7 (1.702 m)   Wt 210 lb 1.3 oz (95.3 kg)   SpO2 100%   BMI 32.90 kg/m    Physical  Exam Constitutional:      General: She is not in acute distress.    Appearance: Normal appearance.  Eyes:     Pupils: Pupils are equal, round, and reactive to light.  Cardiovascular:     Rate and Rhythm: Normal rate and regular rhythm.     Heart sounds: Normal heart sounds. No murmur heard.    No friction rub. No gallop.  Pulmonary:     Effort: Pulmonary effort is normal. No respiratory distress.     Breath sounds: Normal breath sounds.  Abdominal:     General: Abdomen is flat. Bowel sounds are normal.      Palpations: Abdomen is soft.  Musculoskeletal:        General: No swelling.     Cervical back: Neck supple.  Lymphadenopathy:     Cervical: No cervical adenopathy.  Skin:    General: Skin is warm and dry.  Neurological:     General: No focal deficit present.     Mental Status: She is alert.  Psychiatric:        Mood and Affect: Mood normal.        Behavior: Behavior normal.        Thought Content: Thought content normal.      No results found for any visits on 07/02/24. Last CBC Lab Results  Component Value Date   WBC 5.7 06/25/2024   HGB 13.2 06/25/2024   HCT 40.2 06/25/2024   MCV 93 06/25/2024   MCH 30.4 06/25/2024   RDW 12.6 06/25/2024   PLT 370 06/25/2024   Last metabolic panel Lab Results  Component Value Date   GLUCOSE 100 (H) 06/25/2024   NA 138 06/25/2024   K 4.3 06/25/2024   CL 101 06/25/2024   CO2 23 06/25/2024   BUN 19 06/25/2024   CREATININE 0.89 06/25/2024   EGFR 78 06/25/2024   CALCIUM 9.6 06/25/2024   PROT 6.7 06/25/2024   ALBUMIN 4.5 06/25/2024   LABGLOB 2.2 06/25/2024   AGRATIO 2.0 11/05/2022   BILITOT 0.2 06/25/2024   ALKPHOS 37 (L) 06/25/2024   AST 19 06/25/2024   ALT 16 06/25/2024   ANIONGAP 9 06/25/2021   Last lipids Lab Results  Component Value Date   CHOL 218 (H) 06/25/2024   HDL 61 06/25/2024   LDLCALC 144 (H) 06/25/2024   TRIG 74 06/25/2024   CHOLHDL 3.6 06/25/2024   Last hemoglobin A1c Lab Results  Component Value Date   HGBA1C 5.3 06/25/2024   Last thyroid  functions Lab Results  Component Value Date   TSH 1.700 06/25/2024   FREET4 1.14 03/02/2018   Last vitamin D  Lab Results  Component Value Date   VD25OH 53.1 06/25/2024        Assessment & Plan:    Routine Health Maintenance and Physical Exam  Health Maintenance  Topic Date Due   Hepatitis B Vaccine (1 of 3 - 19+ 3-dose series) Never done   Pneumococcal Vaccine for age over 22 (1 of 1 - PCV) Never done   Zoster (Shingles) Vaccine (1 of 2) Never done    Pap with HPV screening  04/18/2024   COVID-19 Vaccine (3 - 2025-26 season) 05/10/2024   Flu Shot  12/07/2024*   Breast Cancer Screening  03/11/2025   Colon Cancer Screening  09/04/2029   DTaP/Tdap/Td vaccine (3 - Td or Tdap) 11/05/2032   Hepatitis C Screening  Completed   HIV Screening  Completed   HPV Vaccine  Aged Out   Meningitis B Vaccine  Aged Out  *Topic was postponed. The date shown is not the original due date.    Discussed health benefits of physical activity, and encouraged her to engage in regular exercise appropriate for her age and condition.  Class 2 severe obesity with serious comorbidity and body mass index (BMI) of 35.0 to 35.9 in adult, unspecified obesity type Assessment & Plan: Visit # :5 Starting Weight: 247 lbs   Current weight: 210 Previous weight: 215 Change in weight: -37 lbs  Dietary goals: High protein, low carb Exercise goals: More routine/planned walking  Medication: Phentermine  30 mg daily, topiramate  25 mg daily  Follow-up and referrals: follow up in 3 months  Orders: -     Phentermine  HCl; Take 1 capsule (30 mg total) by mouth every morning.  Dispense: 30 capsule; Refill: 2  Essential hypertension Assessment & Plan: Stable.  Continue amlodipine -valsartan  and hydrochlorothiazide . Will continue to monitor.   Pure hypercholesterolemia Assessment & Plan: Last lipid panel: LDL 144, HDL 61, Trig 74. The 10-year ASCVD risk score (Arnett DK, et al., 2019) is: 1.8%. . Patient would like to work on continuing with her exercise routine and weight management before starting any medication. We will continue to monitor her cholesterol levels as every 6 to 12 months.     Dysthymia Assessment & Plan: Managed with Prozac  40 mg daily. Reports doing well. - Continue Prozac  40 mg daily.   Constipation, unspecified constipation type Assessment & Plan: Chronic constipation likely due to slow transit. No thyroid  dysfunction. - Increase fiber intake to 10  grams daily. - Ensure adequate hydration. - Use Miralax daily as needed. - Consider Dulcolax or Docusate for acute constipation.     Return in about 6 months (around 12/31/2024) for Med check, Weight.     Saddie JULIANNA Sacks, PA-C

## 2024-07-05 NOTE — Assessment & Plan Note (Signed)
Stable.  Continue amlodipine-valsartan and hydrochlorothiazide. Will continue to monitor.

## 2024-07-05 NOTE — Assessment & Plan Note (Signed)
 Last lipid panel: LDL 144, HDL 61, Trig 74. The 10-year ASCVD risk score (Arnett DK, et al., 2019) is: 1.8%. . Patient would like to work on continuing with her exercise routine and weight management before starting any medication. We will continue to monitor her cholesterol levels as every 6 to 12 months.

## 2024-07-05 NOTE — Assessment & Plan Note (Signed)
 Managed with Prozac  40 mg daily. Reports doing well. - Continue Prozac  40 mg daily.

## 2024-07-10 ENCOUNTER — Other Ambulatory Visit: Payer: Self-pay

## 2024-07-10 DIAGNOSIS — E66812 Obesity, class 2: Secondary | ICD-10-CM

## 2024-07-12 ENCOUNTER — Other Ambulatory Visit: Payer: Self-pay

## 2024-07-12 DIAGNOSIS — Z6835 Body mass index (BMI) 35.0-35.9, adult: Secondary | ICD-10-CM

## 2024-07-12 MED ORDER — PHENTERMINE HCL 30 MG PO CAPS: 30.0000 mg | ORAL_CAPSULE | ORAL | 2 refills | Status: AC

## 2024-07-15 ENCOUNTER — Other Ambulatory Visit: Payer: Self-pay | Admitting: Obstetrics

## 2024-07-15 DIAGNOSIS — R928 Other abnormal and inconclusive findings on diagnostic imaging of breast: Secondary | ICD-10-CM

## 2024-07-22 ENCOUNTER — Ambulatory Visit

## 2024-07-22 ENCOUNTER — Ambulatory Visit
Admission: RE | Admit: 2024-07-22 | Discharge: 2024-07-22 | Disposition: A | Discharge status: Home / Self Care | Source: Ambulatory Visit | Attending: Obstetrics | Admitting: Obstetrics

## 2024-07-22 DIAGNOSIS — R928 Other abnormal and inconclusive findings on diagnostic imaging of breast: Secondary | ICD-10-CM

## 2024-08-17 ENCOUNTER — Other Ambulatory Visit: Payer: Self-pay | Admitting: Family Medicine

## 2024-08-17 DIAGNOSIS — I10 Essential (primary) hypertension: Secondary | ICD-10-CM

## 2024-08-17 DIAGNOSIS — F341 Dysthymic disorder: Secondary | ICD-10-CM

## 2024-08-17 DIAGNOSIS — Z6379 Other stressful life events affecting family and household: Secondary | ICD-10-CM

## 2024-12-31 ENCOUNTER — Ambulatory Visit

## 2025-06-28 ENCOUNTER — Other Ambulatory Visit

## 2025-07-05 ENCOUNTER — Encounter
